# Patient Record
Sex: Male | Born: 1965 | Race: Black or African American | Hispanic: No | Marital: Single | Smoking: Former smoker
Health system: Southern US, Community
[De-identification: ages and names within clinical notes are randomized; demographics above are authoritative.]

## PROBLEM LIST (undated history)

## (undated) DIAGNOSIS — D550 Anemia due to glucose-6-phosphate dehydrogenase [G6PD] deficiency: Secondary | ICD-10-CM

## (undated) DIAGNOSIS — M1990 Unspecified osteoarthritis, unspecified site: Secondary | ICD-10-CM

## (undated) DIAGNOSIS — K429 Umbilical hernia without obstruction or gangrene: Secondary | ICD-10-CM

## (undated) DIAGNOSIS — M549 Dorsalgia, unspecified: Secondary | ICD-10-CM

## (undated) DIAGNOSIS — G8929 Other chronic pain: Secondary | ICD-10-CM

## (undated) HISTORY — DX: Umbilical hernia without obstruction or gangrene: K42.9

---

## 1998-11-08 ENCOUNTER — Emergency Department (HOSPITAL_COMMUNITY): Admission: EM | Admit: 1998-11-08 | Discharge: 1998-11-08 | Payer: Self-pay | Admitting: *Deleted

## 2011-04-15 ENCOUNTER — Encounter (INDEPENDENT_AMBULATORY_CARE_PROVIDER_SITE_OTHER): Payer: Self-pay | Admitting: General Surgery

## 2011-04-15 ENCOUNTER — Ambulatory Visit (INDEPENDENT_AMBULATORY_CARE_PROVIDER_SITE_OTHER): Payer: BC Managed Care – PPO | Admitting: General Surgery

## 2011-04-15 VITALS — BP 124/88 | HR 68 | Temp 98.6°F | Resp 12 | Ht 65.0 in | Wt 166.8 lb

## 2011-04-15 DIAGNOSIS — K429 Umbilical hernia without obstruction or gangrene: Secondary | ICD-10-CM

## 2011-04-15 NOTE — Progress Notes (Signed)
Patient ID: Caleb Foley, male   DOB: 04-24-1966, 45 y.o.   MRN: [id number]  Chief Complaint  Patient presents with  . Other    new pt- eval umb hernia    HPI Caleb Foley is a 45 y.o. male.  This patient is referred by Dr. Geralyn Flash. He has noted a bulge at his umbilicus for "all my life" and he has occasional discomfort in the area which comes on at random times. He states that he has pain worse with laughing and straining and states that the bulge has been slowly increasing in size. He has no tract symptoms and states that his bowels are normal but they have been increasingly loose over the last 2 months. He denies any other obstructive symptoms such as nausea or vomiting. He has some bright blood on the tissue and after wiping but denies a family history of colon cancer and denies any prior history of colonoscopy HPI  Past Medical History  Diagnosis Date  . Umbilical hernia     History reviewed. No pertinent past surgical history.  History reviewed. No pertinent family history.  Social History History  Substance Use Topics  . Smoking status: Current Everyday Smoker    Types: Cigars  . Smokeless tobacco: Not on file   Comment: smoking 4 black n milds a day  . Alcohol Use: Yes  All other review of systems negative or noncontributory except as stated in the HPI   No Known Allergies  No current outpatient prescriptions on file.    Review of Systems Review of Systems  Blood pressure 124/88, pulse 68, temperature 98.6 F (37 C), temperature source Temporal, resp. rate 12, height 5\' 5"  (1.651 m), weight 166 lb 12.8 oz (75.66 kg).  Physical Exam Physical Exam  Vitals reviewed. Constitutional: He is oriented to person, place, and time. He appears well-developed and well-nourished. No distress.  HENT:  Head: Normocephalic and atraumatic.  Eyes: Conjunctivae are normal. Pupils are equal, round, and reactive to light. Right eye exhibits no discharge.  Left eye exhibits no discharge. No scleral icterus.  Neck: Normal range of motion. No tracheal deviation present.  Cardiovascular: Normal rate, regular rhythm and normal heart sounds.   Pulmonary/Chest: Effort normal and breath sounds normal. No stridor. No respiratory distress. He has no wheezes.  Abdominal: Soft. Bowel sounds are normal. He exhibits no distension and no mass. There is no tenderness. There is no rebound and no guarding.       Reducible umbilical hernia with 3cm palpable defect.  Musculoskeletal: Normal range of motion. He exhibits no edema.  Neurological: He is alert and oriented to person, place, and time.  Skin: Skin is warm and dry. No rash noted. He is not diaphoretic. No erythema. No pallor.  Psychiatric: He has a normal mood and affect. His behavior is normal. Judgment and thought content normal.    Data Reviewed   Assessment    Reducible umbilical hernia  He does have a reducible umbilical hernia which is moderate size and is symptomatic. Since he is symptomatic and it is increasing in size I did offer surgical repair. I discussed with him the options of watchful waiting versus surgery and the risks of the procedure including infection, bleeding, pain, scarring, recurrence, persistent symptoms, bowel injury and he expressed understanding and desires to proceed with open umbilical hernia repair with mesh.  Bright red blood per rectum  I think that is it is likely due to hemorrhoids or other anal cause. However, since  he has not had a prior colonoscopy I did recommend that he have a colonoscopy.  Plan    Plan for open umbilical hernia repair soon as available. Recommend outpatient colonoscopy to evaluate rectal bleeding.       Lodema Pilot DAVID 04/15/2011, 3:11 PM

## 2011-04-30 ENCOUNTER — Ambulatory Visit: Admit: 2011-04-30 | Payer: Self-pay | Admitting: General Surgery

## 2011-04-30 DIAGNOSIS — K429 Umbilical hernia without obstruction or gangrene: Secondary | ICD-10-CM

## 2011-04-30 HISTORY — PX: HERNIA REPAIR: SHX51

## 2011-04-30 SURGERY — REPAIR, HERNIA, UMBILICAL, ADULT
Anesthesia: General

## 2011-05-26 ENCOUNTER — Telehealth (INDEPENDENT_AMBULATORY_CARE_PROVIDER_SITE_OTHER): Payer: Self-pay

## 2011-05-26 NOTE — Telephone Encounter (Addendum)
Called to speak with patient regarding his appointment on 05/27/11 w/Dr. Biagio Quint.  Was placed on hold for over 7 minutes until I disconnected the call, attempted to call back, no answer and left message on voicemail to call our office at 8:30 regarding his appointment time has changed to 9:00 am.

## 2011-05-27 ENCOUNTER — Encounter (INDEPENDENT_AMBULATORY_CARE_PROVIDER_SITE_OTHER): Payer: Self-pay | Admitting: General Surgery

## 2011-05-27 ENCOUNTER — Ambulatory Visit (INDEPENDENT_AMBULATORY_CARE_PROVIDER_SITE_OTHER): Payer: BC Managed Care – PPO | Admitting: General Surgery

## 2011-05-27 VITALS — BP 142/104 | HR 76 | Temp 97.4°F | Resp 16 | Ht 65.0 in | Wt 171.2 lb

## 2011-05-27 DIAGNOSIS — Z5189 Encounter for other specified aftercare: Secondary | ICD-10-CM

## 2011-05-27 DIAGNOSIS — Z4889 Encounter for other specified surgical aftercare: Secondary | ICD-10-CM

## 2011-05-27 NOTE — Progress Notes (Signed)
Subjective:     Patient ID: Caleb Foley, male   DOB: 02-24-1966, 46 y.o.   MRN: [id number]  HPI This patient follows up roughly 2 weeks status post open umbilical hernia repair with mesh. He's been doing well he does have some occasional discomfort in the region. He is tolerating regular diet and her bowels are functional, no other complaints.  Review of Systems     Objective:   Physical Exam No acute distress and nontoxic-appearing  His abdomen is soft and nontender on exam his incision is healing well without sign of infection there is no evidence of recurrence. He does have some mild tenderness in the area but no bulge or sign of recurrence     Assessment:     Status post open umbilical hernia repair with mesh-doing well    Plan:     I recommended that he continue with another week to light-duty but at approximately 4 weeks with surgery he can gradually increase his activity as tolerated. There is no sign of postoperative complications and he appears to be doing well. I did recommend that he not do any heavy lifting for another few weeks if he can avoid this.

## 2011-11-16 DIAGNOSIS — Z0271 Encounter for disability determination: Secondary | ICD-10-CM

## 2012-04-20 ENCOUNTER — Ambulatory Visit (INDEPENDENT_AMBULATORY_CARE_PROVIDER_SITE_OTHER): Payer: BC Managed Care – PPO | Admitting: Family Medicine

## 2012-04-20 VITALS — BP 112/75 | HR 87 | Temp 98.4°F | Resp 18 | Ht 67.0 in | Wt 158.0 lb

## 2012-04-20 DIAGNOSIS — M5431 Sciatica, right side: Secondary | ICD-10-CM

## 2012-04-20 DIAGNOSIS — M4800 Spinal stenosis, site unspecified: Secondary | ICD-10-CM

## 2012-04-20 DIAGNOSIS — R202 Paresthesia of skin: Secondary | ICD-10-CM

## 2012-04-20 DIAGNOSIS — R209 Unspecified disturbances of skin sensation: Secondary | ICD-10-CM

## 2012-04-20 LAB — POCT GLYCOSYLATED HEMOGLOBIN (HGB A1C): Hemoglobin A1C: 5

## 2012-04-20 MED ORDER — METHYLPREDNISOLONE 4 MG PO KIT: PACK | ORAL | Status: DC

## 2012-04-20 NOTE — Progress Notes (Signed)
[facility]:  Office Visit  Chief Complaint:  Chief Complaint  Patient presents with  . pinched nerve    back    HPI: Caleb Foley is a 46 y.o. male who complains of right lower back pain and has had pain that goes from his right buttock, posterior thigh and then at knee it changes to the lateral part of his leg. He woke up this morning with numbness and tingling in his last 4 digits. He has tried ibuprofen without much relief. He is a Naval architect, [employer]. He states that he has stiffness in leg and buttock and is improved with movement as the day goes on. He denies any weakness, no problems with urinary incontinence.  HE was seen at [facility] today, xray was done of lower back, he was told that he had "pinch nerve", He was rx Ibuprofen and Flexeril. Was told to come to family practice to get referral to PT. Deneis any h/o diabetes. NKI. COnstant numbness/tingling. He used to wear his wallet under his right butt pocket, he started taking it out when driving after sxs  Past Medical History  Diagnosis Date  . Umbilical hernia    Past Surgical History  Procedure Date  . Hernia repair 04/30/11    umb hernia   History   Social History  . Marital Status: Single    Spouse Name: N/A    Number of Children: N/A  . Years of Education: N/A   Social History Main Topics  . Smoking status: Current Every Day Smoker    Types: Cigars  . Smokeless tobacco: None     Comment: smoking 4 black n milds a day  . Alcohol Use: Yes  . Drug Use: No  . Sexually Active:    Other Topics Concern  . None   Social History Narrative  . None   History reviewed. No pertinent family history. No Known Allergies Prior to Admission medications   Not on File     ROS: The patient denies fevers, chills, night sweats, unintentional weight loss, chest pain, palpitations, wheezing, dyspnea on exertion, nausea, vomiting, abdominal pain, dysuria, hematuria, melena, ,  weakness  All other systems have been reviewed and were otherwise negative with the exception of those mentioned in the HPI and as above.    PHYSICAL EXAM: Filed Vitals:   04/20/12 1301  BP: 112/75  Pulse: 87  Temp: 98.4 F (36.9 C)  Resp: 18   Filed Vitals:   04/20/12 1301  Height: 5\' 7"  (1.702 m)  Weight: 158 lb (71.668 kg)   Body mass index is 24.75 kg/(m^2).  General: Alert, no acute distress HEENT:  Normocephalic, atraumatic, oropharynx patent.  Cardiovascular:  Regular rate and rhythm, no rubs murmurs or gallops.  No Carotid bruits, radial pulse intact. No pedal edema.  Respiratory: Clear to auscultation bilaterally.  No wheezes, rales, or rhonchi.  No cyanosis, no use of accessory musculature GI: No organomegaly, abdomen is soft and non-tender, positive bowel sounds.  No masses. Skin: No rashes. Neurologic: Facial musculature symmetric. Psychiatric: Patient is appropriate throughout our interaction. Lymphatic: No cervical lymphadenopathy Musculoskeletal: Gait intact. Lumbar spine-nl Sacrum-mid buttock pain at piriformis Right leg and foot 5/5 strength Decrease sensation in right dorsum of foot in S1 distribution No saddle anesthesia + straight leg     LABS: Results for orders placed in visit on 04/20/12  POCT GLYCOSYLATED HEMOGLOBIN (HGB A1C)      Component Value Range   Hemoglobin A1C  5.0       EKG/XRAY:   Primary read interpreted by Dr. Conley Rolls at [facility].   ASSESSMENT/PLAN: Encounter Diagnoses  Name Primary?  . Paresthesia Yes  . Sciatica of right side    ? Sciatica vs piriformis syndrome Ardith Dark MD at [facility], [phone] ( need to get back xray radiology report fromt heir office) Rx Medrol dose pack Patient already has rx for flexeril and also ibuprofen Use flexeril only at night. Advise to return on Apr 26, 2012. He may still work, he has no loss of strength, just some mild S1 distribution numbness and tingling in his right  leg    Damia Bobrowski PHUONG, DO 04/20/2012 2:23 PM

## 2012-04-26 ENCOUNTER — Ambulatory Visit (INDEPENDENT_AMBULATORY_CARE_PROVIDER_SITE_OTHER): Payer: BC Managed Care – PPO | Admitting: Internal Medicine

## 2012-04-26 ENCOUNTER — Ambulatory Visit: Payer: BC Managed Care – PPO

## 2012-04-26 VITALS — BP 124/75 | HR 91 | Temp 97.5°F | Resp 18 | Ht 67.0 in | Wt 158.0 lb

## 2012-04-26 DIAGNOSIS — IMO0002 Reserved for concepts with insufficient information to code with codable children: Secondary | ICD-10-CM

## 2012-04-26 DIAGNOSIS — M5417 Radiculopathy, lumbosacral region: Secondary | ICD-10-CM

## 2012-04-26 DIAGNOSIS — M5416 Radiculopathy, lumbar region: Secondary | ICD-10-CM

## 2012-04-26 DIAGNOSIS — R202 Paresthesia of skin: Secondary | ICD-10-CM

## 2012-04-26 MED ORDER — PREDNISONE 10 MG PO TABS: ORAL_TABLET | ORAL | Status: DC

## 2012-04-26 NOTE — Progress Notes (Signed)
  Subjective:    Patient ID: Caleb Foley, male    DOB: 06-May-1966, 46 y.o.   MRN: [id number]  HPI Lowback pain started one day not assoc with a remembered injury. 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   Review of Systems     Objective:   Physical Exam  Constitutional: He is oriented to person, place, and time. He appears well-developed.  Musculoskeletal:       Lumbar back: He exhibits tenderness and spasm. He exhibits normal range of motion.       Back:  Neurological: He is alert and oriented to person, place, and time. He displays abnormal reflex. A sensory deficit is present. He exhibits abnormal muscle tone. Coordination and gait normal.  Reflex Scores:      Patellar reflexes are 2+ on the right side and 2+ on the left side.      Achilles reflexes are 2+ on the right side and 1+ on the left side. Psychiatric: He has a normal mood and affect.  weakness plantar flexion    [facility] reading (PRIMARY) by  Dr.Guest mild spondylosis, right SI joint unusual looking      Assessment & Plan:  LB injury with radiculopathy, paresthesias and weak right plantar flexion and ankle dtr Back care/Rest, avoid sitting, do no bending or lifting Prednisone taper/flexeril/ No nsaids with prednisone

## 2012-04-26 NOTE — Patient Instructions (Signed)
Disc Back Pain, Adult Low back pain is very common. About 1 in 5 people have back pain.The cause of low back pain is rarely dangerous. The pain often gets better over time.About half of people with a sudden onset of back pain feel better in just 2 weeks. About 8 in 10 people feel better by 6 weeks.  CAUSES Some common causes of back pain include:  Strain of the muscles or ligaments supporting the spine.  Wear and tear (degeneration) of the spinal discs.  Arthritis.  Direct injury to the back. DIAGNOSIS Most of the time, the direct cause of low back pain is not known.However, back pain can be treated effectively even when the exact cause of the pain is unknown.Answering your caregiver's questions about your overall health and symptoms is one of the most accurate ways to make sure the cause of your pain is not dangerous. If your caregiver needs more information, he or she may order lab work or imaging tests (X-rays or MRIs).However, even if imaging tests show changes in your back, this usually does not require surgery. HOME CARE INSTRUCTIONS For many people, back pain returns.Since low back pain is rarely dangerous, it is often a condition that people can learn to [facility] their own.   Remain active. It is stressful on the back to sit or stand in one place. Do not sit, drive, or stand in one place for more than 30 minutes at a time. Take short walks on level surfaces as soon as pain allows.Try to increase the length of time you walk each day.  Do not stay in bed.Resting more than 1 or 2 days can delay your recovery.  Do not avoid exercise or work.Your body is made to move.It is not dangerous to be active, even though your back may hurt.Your back will likely heal faster if you return to being active before your pain is gone.  Pay attention to your body when you bend and lift. Many people have less discomfortwhen lifting if they bend their knees, keep the load close to their  bodies,and avoid twisting. Often, the most comfortable positions are those that put less stress on your recovering back.  Find a comfortable position to sleep. Use a firm mattress and lie on your side with your knees slightly bent. If you lie on your back, put a pillow under your knees.  Only take over-the-counter or prescription medicines as directed by your caregiver. Over-the-counter medicines to reduce pain and inflammation are often the most helpful.Your caregiver may prescribe muscle relaxant drugs.These medicines help dull your pain so you can more quickly return to your normal activities and healthy exercise.  Put ice on the injured area.  Put ice in a plastic bag.  Place a towel between your skin and the bag.  Leave the ice on for 15 to 20 minutes, 3 to 4 times a day for the first 2 to 3 days. After that, ice and heat may be alternated to reduce pain and spasms.  Ask your caregiver about trying back exercises and gentle massage. This may be of some benefit.  Avoid feeling anxious or stressed.Stress increases muscle tension and can worsen back pain.It is important to recognize when you are anxious or stressed and learn ways to manage it.Exercise is a great option. SEEK MEDICAL CARE IF:  You have pain that is not relieved with rest or medicine.  You have pain that does not improve in 1 week.  You have new symptoms.  You are  generally not feeling well. SEEK IMMEDIATE MEDICAL CARE IF:   You have pain that radiates from your back into your legs.  You develop new bowel or bladder control problems.  You have unusual weakness or numbness in your arms or legs.  You develop nausea or vomiting.  You develop abdominal pain.  You feel faint. Document Released: 05/04/2005 Document Revised: 11/03/2011 Document Reviewed: 09/22/2010 [facility] Patient Information 2013 [place], [place].

## 2012-05-03 ENCOUNTER — Ambulatory Visit
Admission: RE | Admit: 2012-05-03 | Discharge: 2012-05-03 | Disposition: A | Discharge status: Home / Self Care | Payer: BC Managed Care – PPO | Source: Ambulatory Visit | Attending: Internal Medicine | Admitting: Internal Medicine

## 2012-05-03 ENCOUNTER — Ambulatory Visit (INDEPENDENT_AMBULATORY_CARE_PROVIDER_SITE_OTHER): Payer: BC Managed Care – PPO | Admitting: Internal Medicine

## 2012-05-03 VITALS — BP 133/89 | HR 94 | Temp 98.3°F | Resp 16 | Ht 65.0 in | Wt 171.0 lb

## 2012-05-03 DIAGNOSIS — M5410 Radiculopathy, site unspecified: Secondary | ICD-10-CM

## 2012-05-03 DIAGNOSIS — R200 Anesthesia of skin: Secondary | ICD-10-CM

## 2012-05-03 DIAGNOSIS — S39012A Strain of muscle, fascia and tendon of lower back, initial encounter: Secondary | ICD-10-CM

## 2012-05-03 DIAGNOSIS — M545 Low back pain: Secondary | ICD-10-CM

## 2012-05-03 DIAGNOSIS — M549 Dorsalgia, unspecified: Secondary | ICD-10-CM

## 2012-05-03 DIAGNOSIS — R209 Unspecified disturbances of skin sensation: Secondary | ICD-10-CM

## 2012-05-03 MED ORDER — TRAMADOL HCL 50 MG PO TABS: 50.0000 mg | ORAL_TABLET | Freq: Three times a day (TID) | ORAL | Status: DC | PRN

## 2012-05-03 NOTE — Progress Notes (Signed)
  Subjective:    Patient ID: Caleb Foley, male    DOB: Jan 06, 1966, 46 y.o.   MRN: [id number]  HPI Pt presents to clinic today for a recheck of his LBP. He finished his course of Prednisone Sunday 05/01/12 and he still is continuing to take the Flexeril at nighttime. Pt reports the numbness and tingling sensations going down the Rt leg came back on Sunday 05/01/12.  Pt states this is the same way he felt after the first round of Prednisone he took at the beginning of the month prescribed by Dr. Conley Rolls.  Has pain right LS into right leg with numbness and weakness. He has suffered over 2 months and no better. 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       Assessment & Plan:  MRI no contrast tonite or tomorrow Tramadol prn pain

## 2012-05-03 NOTE — Progress Notes (Signed)
  Subjective:    Patient ID: Caleb Foley, male    DOB: 09/25/1965, 46 y.o.   MRN: [id number]  HPI Low back pain with radiculopathy no better. Pred helped , then sxs returned.   Review of Systems     Objective:   Physical Exam        Assessment & Plan:

## 2012-05-03 NOTE — Patient Instructions (Addendum)

## 2012-05-05 ENCOUNTER — Other Ambulatory Visit: Payer: Self-pay | Admitting: Radiology

## 2012-05-05 DIAGNOSIS — M5126 Other intervertebral disc displacement, lumbar region: Secondary | ICD-10-CM

## 2012-05-06 ENCOUNTER — Encounter (HOSPITAL_COMMUNITY): Payer: Self-pay | Admitting: Pharmacy Technician

## 2012-05-06 ENCOUNTER — Other Ambulatory Visit: Payer: Self-pay | Admitting: Neurosurgery

## 2012-05-09 ENCOUNTER — Encounter (HOSPITAL_COMMUNITY)
Admission: RE | Admit: 2012-05-09 | Discharge: 2012-05-09 | Disposition: A | Discharge status: Home / Self Care | Payer: BC Managed Care – PPO | Source: Ambulatory Visit | Attending: Neurosurgery | Admitting: Neurosurgery

## 2012-05-09 ENCOUNTER — Encounter (HOSPITAL_COMMUNITY): Payer: Self-pay | Admitting: Vascular Surgery

## 2012-05-09 ENCOUNTER — Encounter (HOSPITAL_COMMUNITY): Payer: Self-pay

## 2012-05-09 HISTORY — DX: Dorsalgia, unspecified: M54.9

## 2012-05-09 HISTORY — DX: Other chronic pain: G89.29

## 2012-05-09 HISTORY — DX: Unspecified osteoarthritis, unspecified site: M19.90

## 2012-05-09 LAB — BASIC METABOLIC PANEL
CO2: 26 mEq/L (ref 19–32)
Chloride: 101 mEq/L (ref 96–112)
GFR calc non Af Amer: >90 mL/min (ref >90)
Glucose, Bld: 102 mg/dL — ABNORMAL HIGH (ref 70–99)
Potassium: 4 mEq/L (ref 3.5–5.1)
Sodium: 137 mEq/L (ref 135–145)

## 2012-05-09 LAB — CBC
HCT: 23.9 % — ABNORMAL LOW (ref 39.0–52.0)
Hemoglobin: 7.4 g/dL — ABNORMAL LOW (ref 13.0–17.0)
MCHC: 31 g/dL (ref 30.0–36.0)
RBC: 3.21 MIL/uL — ABNORMAL LOW (ref 4.22–5.81)
WBC: 7.6 10*3/uL (ref 4.0–10.5)

## 2012-05-09 LAB — SURGICAL PCR SCREEN: Staphylococcus aureus: NEGATIVE

## 2012-05-09 MED ORDER — CEFAZOLIN SODIUM-DEXTROSE 2-3 GM-% IV SOLR
2.0000 g | INTRAVENOUS | Status: DC
  Filled 2012-05-09: qty 50

## 2012-05-09 NOTE — Pre-Procedure Instructions (Signed)
[name]  05/09/2012   Your procedure is scheduled on:  Tuesday May 10, 2012  Report to [facility] Stay [facility] at 5:30 AM.  Call this number if you have problems the morning of surgery: [phone]   Remember:   Do not eat food or drink :After Midnight.    Take these medicines the morning of surgery with A SIP OF WATER: tramadol   Do not wear jewelry, make-up or nail polish.  Do not wear lotions, powders, or perfumes.  Do not shave 48 hours prior to surgery. Men may shave face and neck.  Do not bring valuables to the hospital.  Contacts, dentures or bridgework may not be worn into surgery.  Leave suitcase in the car. After surgery it may be brought to your room.  For patients admitted to the hospital, checkout time is 11:00 AM the day of discharge.   Patients discharged the day of surgery will not be allowed to drive home.  Name and phone number of your driver: family / friend  Special Instructions: Shower using CHG 2 nights before surgery and the night before surgery.  If you shower the day of surgery use CHG.  Use special wash - you have one bottle of CHG for all showers.  You should use approximately 1/3 of the bottle for each shower.   Please read over the following fact sheets that you were given: Pain Booklet, Coughing and Deep Breathing, MRSA Information and Surgical Site Infection Prevention

## 2012-05-09 NOTE — Pre-Procedure Instructions (Deleted)
[name]  05/09/2012   Your procedure is scheduled on:  Tuesday May 10, 2012  Report to [facility] Stay [facility] at 5:30 AM.  Call this number if you have problems the morning of surgery: [phone]   Remember:   Do not eat food or drink :After Midnight.    Take these medicines the morning of surgery with A SIP OF WATER: tramadol   Do not wear jewelry, make-up or nail polish.  Do not wear lotions, powders, or perfumes.  Do not shave 48 hours prior to surgery. Men may shave face and neck.  Do not bring valuables to the hospital.  Contacts, dentures or bridgework may not be worn into surgery.  Leave suitcase in the car. After surgery it may be brought to your room.  For patients admitted to the hospital, checkout time is 11:00 AM the day of discharge.   Patients discharged the day of surgery will not be allowed to drive home.  Name and phone number of your driver: family / friend  Special Instructions: Shower using CHG 2 nights before surgery and the night before surgery.  If you shower the day of surgery use CHG.  Use special wash - you have one bottle of CHG for all showers.  You should use approximately 1/3 of the bottle for each shower.   Please read over the following fact sheets that you were given: Pain Booklet, Coughing and Deep Breathing, Total Joint Packet and MRSA Information

## 2012-05-09 NOTE — Consult Note (Signed)
Anesthesia Chart Review:  Patient is a 46 year old male scheduled for right L4-5 diskectomy by Dr. Franky Macho tomorrow at 0730.  His PAT appointment was earlier today.  I did not see him, but was asked to review his labs.  History includes smoking, arthritis, back pain.  No PCP is listed.  He did not meet anesthesia's requirements for pre-operative EKG and CXR.    Preoperative labs showed a H/H 7.4/23.9.  Dr. Franky Macho is aware of these results and is okay with proceeding to the OR.  I'll order a repeat CBC and T&S on arrival. Anesthesiologist Dr. Chaney Malling agrees with this plan.  Shonna Chock, PA-C 05/09/12 1507

## 2012-05-09 NOTE — Progress Notes (Signed)
Spoke with Sherri with Dr. Sueanne Margarita office, State Dr. Franky Macho states ok for surgery.

## 2012-05-09 NOTE — Progress Notes (Addendum)
Forwarded chart to anesthesia for review due to abnormal labs 05/09/12. Spoke with Revonda Standard regarding patients abnormal hgb.  Requested Dr. Sueanne Margarita office be contacted. Notified Jessica with Dr. Sueanne Margarita office.

## 2012-05-10 ENCOUNTER — Telehealth: Payer: Self-pay | Admitting: Internal Medicine

## 2012-05-10 ENCOUNTER — Encounter (HOSPITAL_COMMUNITY): Payer: Self-pay | Admitting: *Deleted

## 2012-05-10 ENCOUNTER — Ambulatory Visit (HOSPITAL_COMMUNITY)
Admission: RE | Admit: 2012-05-10 | Discharge: 2012-05-10 | Disposition: A | Discharge status: Home / Self Care | Payer: BC Managed Care – PPO | Source: Ambulatory Visit | Attending: Neurosurgery | Admitting: Neurosurgery

## 2012-05-10 ENCOUNTER — Encounter (HOSPITAL_COMMUNITY): Payer: Self-pay | Admitting: Vascular Surgery

## 2012-05-10 ENCOUNTER — Encounter (HOSPITAL_COMMUNITY)
Admission: RE | Disposition: A | Discharge status: Home / Self Care | Payer: Self-pay | Source: Ambulatory Visit | Attending: Neurosurgery

## 2012-05-10 DIAGNOSIS — Z538 Procedure and treatment not carried out for other reasons: Secondary | ICD-10-CM | POA: Insufficient documentation

## 2012-05-10 DIAGNOSIS — M5126 Other intervertebral disc displacement, lumbar region: Secondary | ICD-10-CM | POA: Insufficient documentation

## 2012-05-10 DIAGNOSIS — D649 Anemia, unspecified: Secondary | ICD-10-CM | POA: Insufficient documentation

## 2012-05-10 HISTORY — DX: Anemia due to glucose-6-phosphate dehydrogenase (g6pd) deficiency: D55.0

## 2012-05-10 LAB — TYPE AND SCREEN: Antibody Screen: NEGATIVE

## 2012-05-10 LAB — CBC
HCT: 21.2 % — ABNORMAL LOW (ref 39.0–52.0)
RBC: 2.83 MIL/uL — ABNORMAL LOW (ref 4.22–5.81)
RDW: 17.9 % — ABNORMAL HIGH (ref 11.5–15.5)
WBC: 5.8 10*3/uL (ref 4.0–10.5)

## 2012-05-10 LAB — ABO/RH: ABO/RH(D): O POS

## 2012-05-10 SURGERY — CANCELLED PROCEDURE
Laterality: Right

## 2012-05-10 MED ORDER — BUPIVACAINE LIPOSOME 1.3 % IJ SUSP
20.0000 mL | INTRAMUSCULAR | Status: DC
  Filled 2012-05-10: qty 20

## 2012-05-10 SURGICAL SUPPLY — 38 items
BAG DECANTER FOR FLEXI CONT (MISCELLANEOUS) ×2 IMPLANT
BENZOIN TINCTURE PRP APPL 2/3 (GAUZE/BANDAGES/DRESSINGS) IMPLANT
BLADE SURG ROTATE 9660 (MISCELLANEOUS) IMPLANT
CANISTER SUCTION 2500CC (MISCELLANEOUS) ×2 IMPLANT
CLOTH BEACON ORANGE TIMEOUT ST (SAFETY) ×2 IMPLANT
CONT SPEC 4OZ CLIKSEAL STRL BL (MISCELLANEOUS) ×2 IMPLANT
DECANTER SPIKE VIAL GLASS SM (MISCELLANEOUS) ×2 IMPLANT
DRAPE LAPAROTOMY 100X72X124 (DRAPES) ×2 IMPLANT
DRAPE MICROSCOPE LEICA (MISCELLANEOUS) ×2 IMPLANT
DRAPE POUCH INSTRU U-SHP 10X18 (DRAPES) ×2 IMPLANT
DRAPE SURG 17X23 STRL (DRAPES) ×2 IMPLANT
DURAPREP 26ML APPLICATOR (WOUND CARE) ×2 IMPLANT
ELECT REM PT RETURN 9FT ADLT (ELECTROSURGICAL) ×2
ELECTRODE REM PT RTRN 9FT ADLT (ELECTROSURGICAL) ×1 IMPLANT
GAUZE SPONGE 4X4 16PLY XRAY LF (GAUZE/BANDAGES/DRESSINGS) IMPLANT
GLOVE ECLIPSE 6.5 STRL STRAW (GLOVE) ×2 IMPLANT
GLOVE EXAM NITRILE LRG STRL (GLOVE) IMPLANT
GLOVE EXAM NITRILE MD LF STRL (GLOVE) IMPLANT
GLOVE EXAM NITRILE XL STR (GLOVE) IMPLANT
GLOVE EXAM NITRILE XS STR PU (GLOVE) IMPLANT
GOWN BRE IMP SLV AUR LG STRL (GOWN DISPOSABLE) ×4 IMPLANT
GOWN BRE IMP SLV AUR XL STRL (GOWN DISPOSABLE) IMPLANT
GOWN STRL REIN 2XL LVL4 (GOWN DISPOSABLE) IMPLANT
KIT BASIN OR (CUSTOM PROCEDURE TRAY) ×2 IMPLANT
KIT ROOM TURNOVER OR (KITS) ×2 IMPLANT
NEEDLE HYPO 25X1 1.5 SAFETY (NEEDLE) ×2 IMPLANT
NEEDLE SPNL 18GX3.5 QUINCKE PK (NEEDLE) IMPLANT
NS IRRIG 1000ML POUR BTL (IV SOLUTION) ×2 IMPLANT
PAD ARMBOARD 7.5X6 YLW CONV (MISCELLANEOUS) ×6 IMPLANT
RUBBERBAND STERILE (MISCELLANEOUS) ×4 IMPLANT
SPONGE GAUZE 4X4 12PLY (GAUZE/BANDAGES/DRESSINGS) IMPLANT
SPONGE LAP 4X18 X RAY DECT (DISPOSABLE) IMPLANT
SPONGE SURGIFOAM ABS GEL SZ50 (HEMOSTASIS) ×2 IMPLANT
STRIP CLOSURE SKIN 1/2X4 (GAUZE/BANDAGES/DRESSINGS) IMPLANT
SYR 20ML ECCENTRIC (SYRINGE) ×2 IMPLANT
TOWEL OR 17X24 6PK STRL BLUE (TOWEL DISPOSABLE) ×2 IMPLANT
TOWEL OR 17X26 10 PK STRL BLUE (TOWEL DISPOSABLE) ×2 IMPLANT
WATER STERILE IRR 1000ML POUR (IV SOLUTION) ×2 IMPLANT

## 2012-05-10 NOTE — Anesthesia Postprocedure Evaluation (Signed)
  Anesthesia Post-op Note  Patient: Caleb Foley  Procedure(s) Performed: Procedure(s) (LRB) with comments: LUMBAR LAMINECTOMY/DECOMPRESSION MICRODISCECTOMY 1 LEVEL (Right) - RIGHT L45 diskectomy  Case cancelled.  Postponed pending work up of anemia, Hb 6.6.

## 2012-05-10 NOTE — Telephone Encounter (Signed)
S/W pt in re NP appt 12/30 @ 9:45 w/Dr. Arbutus Ped.  Referring Dr. Coletta Memos Dx-Chronic Anemia Welcome Packet mailed.

## 2012-05-10 NOTE — Anesthesia Preprocedure Evaluation (Signed)
Anesthesia Evaluation  Patient identified by MRN, date of birth, ID band Patient awake    Reviewed: Allergy & Precautions, H&P , NPO status , Patient's Chart, lab work & pertinent test results  History of Anesthesia Complications Negative for: history of anesthetic complications  Airway Mallampati: II TM Distance: >3 FB Neck ROM: Full    Dental  (+) Dental Advisory Given, Poor Dentition and Chipped   Pulmonary Current Smoker,          Cardiovascular negative cardio ROS      Neuro/Psych Chronic back pain to Right leg: narcotics    GI/Hepatic Neg liver ROS, GERD-  Poorly Controlled,Recent black tarry stool: Hb 6.6 today   Endo/Other  negative endocrine ROS  Renal/GU negative Renal ROS     Musculoskeletal   Abdominal   Peds  Hematology   Anesthesia Other Findings   Reproductive/Obstetrics                           Anesthesia Physical Anesthesia Plan  ASA: III  Anesthesia Plan:    Post-op Pain Management:    Induction:   Airway Management Planned:   Additional Equipment:   Intra-op Plan:   Post-operative Plan:   Informed Consent: I have reviewed the patients History and Physical, chart, labs and discussed the procedure including the risks, benefits and alternatives for the proposed anesthesia with the patient or authorized representative who has indicated his/her understanding and acceptance.     Plan Discussed with: CRNA and Surgeon  Anesthesia Plan Comments: (Patient with recent black, tarry stool, Hb 7.4 2 days ago, 6.6 today.  Discussed with Dr. Krista Blue and Sampson Goon, who agree patient should have hematology and/or GI work up prior to elective back surgery. Patient postponed.  Discussed with Dr. Franky Macho.  )        Anesthesia Quick Evaluation

## 2012-05-10 NOTE — Transfer of Care (Signed)
Immediate Anesthesia Transfer of Care Note  Patient: Caleb Foley  Procedure(s) Performed: Procedure(s) (LRB) with comments: LUMBAR LAMINECTOMY/DECOMPRESSION MICRODISCECTOMY 1 LEVEL (Right) - RIGHT L45 diskectomy  Case cancelled.  Postponed pending work up of Hb 6.6.

## 2012-05-10 NOTE — Preoperative (Signed)
Beta Blockers   Reason not to administer Beta Blockers:Not Applicable 

## 2012-05-10 NOTE — H&P (Signed)
BP 123/79  Pulse 85  Temp 98.2 F (36.8 C) (Oral)  Resp 18  SpO2 99%  HISTORY:     Caleb Foley is a 46 year old gentleman who comes in today for evaluation of a large herniated disc on the right side at L4-5 and a history of pain in his right lower extremity over the last 2 months.  Caleb Foley has never had pain like this in the past.  He says he was essentially tolerating it until his right toe started going numb near the beginning of December that led him to the physician's office.  He was given two rounds of oral steroids, which have not helped.  He says he has been off work now for the last week only because he found it very difficult to work.  Dr. Perrin Maltese felt it best that he not work.  He says he did try to work initially after seeing Dr. Perrin Maltese, but it just did not work out well.  He is a Naval architect and left-handed.    PAST MEDICAL HISTORY:  Otherwise good.    FAMILY HISTORY:    Provided no history on.    PAST SURGICAL HISTORY:  He has had an umbilical hernia.    DRUG ALLERGIES:    No known drug allergies.    SOCIAL HISTORY:    He does smoke two cigars a day.  He does have a history of drinking alcohol once weekly.  No history of substance abuse.  He is 164 cm. in height.  He weighs 169 lbs.  He has a pulse of 84 on examination today.    REVIEW OF SYSTEMS:   Positive for leg pain at rest and with walking.  He denies constitutional, eye, ear, nose, throat, mouth, respiratory, gastrointestinal, genitourinary, musculoskeletal, skin, neurological, psychiatric, endocrine, hematologic and allergic problems.    MEDICATIONS:    Medications are Cyclobenzaprine and Ibuprofen.  He has finished his Prednisone and Tramadol.    EXAMINATION:    On examination he is alert, oriented x 4 and answering all questions appropriately.  Minimal distress.  Gait is mildly antalgic favoring the right lower extremity.  2+ reflexes at the left knee, left ankle, biceps, triceps, brachioradialis, 1+ right knee,  1+ right ankle.  No clonus.  No Hoffmann's sign.  Proprioception intact in both upper    and lower extremities.  He can toe walk and heel walk.  Normal muscle tone, bulk and coordination.  Pupils are equal, round and reactive to light.  Full extraocular movements.  Full visual fields.  Hearing intact to finger rub bilaterally.  Uvula elevates in the midline.  Shoulder shrug is normal.  Tongue protrudes in the midline.  Sclera not injected.  No clubbing, cyanosis or edema.  No cervical masses or bruits.  Lung fields clear.  Heart regular rhythm and rate.  No murmurs or rubs.  Pulse is good at both wrists.  Head is normocephalic, atraumatic.    DIAGNOSTIC STUDIES:   MRI lumbar spine is reviewed and shows a normal conus medullaris and cauda equina.  He has a large herniated disc associated with a degenerated disc at L4-5 eccentric to the right side with significant compression of the thecal sac and right L5 root.  The rest of his spine actually looks fairly good.  Plain x-rays were reviewed.  He has some straightening of his lordosis on that film, but his alignment is otherwise normal.    DIAGNOSIS:     1.  Displaced disc L4-5 to  the right.         2.  Right L5 radiculopathy.    SUMMARY:     Caleb Foley presents today with a 29-month history of conservative treatment, steroids and what he describes as weakness in his lower extremity.  At this point in time I think it very reasonable given the size of the disc herniation and after being offered epidural injections and rejected that we pursue operative decompression.  Risk and benefits, bleeding, infection, no relief, need for further surgery, disc recurrence, damage to the nerves causing one or both legs to be weak, footdrops, possibility of bowel or bladder dysfunction, sexual dysfunction all were discussed.  These were discussed along with other risks.  He understands and wishes to proceed.  We will get this scheduled for Christmas Eve.

## 2012-05-10 NOTE — Progress Notes (Signed)
Dr. Franky Macho notified of hemoglobin of 6.6. No orders given.

## 2012-05-10 NOTE — Telephone Encounter (Signed)
C/D 05/10/12 for appt.05/16/12

## 2012-05-16 ENCOUNTER — Encounter: Payer: Self-pay | Admitting: Internal Medicine

## 2012-05-16 ENCOUNTER — Other Ambulatory Visit (HOSPITAL_BASED_OUTPATIENT_CLINIC_OR_DEPARTMENT_OTHER): Payer: BC Managed Care – PPO | Admitting: Lab

## 2012-05-16 ENCOUNTER — Ambulatory Visit (HOSPITAL_BASED_OUTPATIENT_CLINIC_OR_DEPARTMENT_OTHER): Payer: BC Managed Care – PPO | Admitting: Internal Medicine

## 2012-05-16 ENCOUNTER — Telehealth: Payer: Self-pay | Admitting: Internal Medicine

## 2012-05-16 VITALS — BP 125/88 | HR 87 | Temp 98.9°F | Resp 20 | Ht 65.0 in | Wt 168.2 lb

## 2012-05-16 DIAGNOSIS — D649 Anemia, unspecified: Secondary | ICD-10-CM

## 2012-05-16 DIAGNOSIS — D539 Nutritional anemia, unspecified: Secondary | ICD-10-CM

## 2012-05-16 LAB — CBC & DIFF AND RETIC
Basophils Absolute: 0 10*3/uL (ref 0.0–0.1)
Eosinophils Absolute: 0.1 10*3/uL (ref 0.0–0.5)
HCT: 23.7 % — ABNORMAL LOW (ref 38.4–49.9)
HGB: 7.1 g/dL — ABNORMAL LOW (ref 13.0–17.1)
LYMPH%: 24.1 % (ref 14.0–49.0)
MCV: 72.9 fL — ABNORMAL LOW (ref 79.3–98.0)
MONO#: 0.3 10*3/uL (ref 0.1–0.9)
MONO%: 6 % (ref 0.0–14.0)
NEUT#: 3 10*3/uL (ref 1.5–6.5)
NEUT%: 66.3 % (ref 39.0–75.0)
Platelets: 429 10*3/uL — ABNORMAL HIGH (ref 140–400)
RBC: 3.25 10*6/uL — ABNORMAL LOW (ref 4.20–5.82)
WBC: 4.5 10*3/uL (ref 4.0–10.3)

## 2012-05-16 LAB — LACTATE DEHYDROGENASE (CC13): LDH: 128 U/L (ref 125–245)

## 2012-05-16 LAB — COMPREHENSIVE METABOLIC PANEL (CC13)
ALT: 16 U/L (ref 0–55)
CO2: 25 mEq/L (ref 22–29)
Calcium: 9.4 mg/dL (ref 8.4–10.4)
Chloride: 107 mEq/L (ref 98–107)
Creatinine: 0.9 mg/dL (ref 0.7–1.3)
Glucose: 103 mg/dl — ABNORMAL HIGH (ref 70–99)

## 2012-05-16 LAB — RETICULOCYTES (CHCC)
ABS Retic: 82 10*3/uL (ref 19.0–186.0)
Retic Ct Pct: 2.5 % — ABNORMAL HIGH (ref 0.4–2.3)

## 2012-05-16 MED ORDER — INTEGRA PLUS PO CAPS: 1.0000 | ORAL_CAPSULE | Freq: Every morning | ORAL | Status: DC

## 2012-05-16 NOTE — Progress Notes (Signed)
[facility] Telephone:(336) [phone]   Fax:(336) [phone]  CONSULT NOTE  REFERRING PHYSICIAN: Dr. Coletta Memos  REASON FOR CONSULTATION:  46 years old African American male with severe anemia.  HPI Gabrial Domine is a 46 y.o. male was no significant past medical history except for L4-5 herniated disc as well as history of hernia surgery. The patient was seen recently by Dr. Rema Jasmine for evaluation and consideration of surgical intervention for the for-5 herniated disc. Preoperative evaluation with CBC on 05/09/2012 showed low hemoglobin of 7.4 g/dL and hematocrit 45.4%. Repeat CBC on 05/10/2012 showed further decline in his hemoglobin to 6.6 g/dL and hematocrit of 09.8%. His MCV was 74.9. The patient has normal white blood count of 5.8 and normal platelets count of 277,000. He was referred to me today for evaluation and recommendation regarding his anemia. The patient denied having any known history of anemia. He denied having any bleeding issues but he has some problem with hemorrhoids. He has no bruises or ecchymosis.  His main complaint is fatigue and shortness of breath with exertion but he was able to continue to work. He denied having any significant weight loss or night sweats. He denied having any chest pain, cough or hemoptysis. His family history significant for a mother with sickle cell trait and distant cousins with sickle cell disease. The patient is single and has no children. He works as a Naval architect. He smokes cigars on daily basis for the last 9 years and trying to quit. He has no history of alcohol or drug abuse. @SFHPI @  Past Medical History  Diagnosis Date  . Umbilical hernia   . Arthritis   . Chronic back pain   . G6PD deficiency anemia     Past Surgical History  Procedure Date  . Hernia repair 04/30/11    umb hernia    History reviewed. No pertinent family history.  Social History History  Substance Use Topics  . Smoking status: Current  Every Day Smoker    Types: Cigars  . Smokeless tobacco: Not on file     Comment: smoking 4 black n milds a day  . Alcohol Use: Yes     Comment: "social"    No Known Allergies  Current Outpatient Prescriptions  Medication Sig Dispense Refill  . ibuprofen (ADVIL,MOTRIN) 600 MG tablet Take 600 mg by mouth every 8 (eight) hours as needed. For pain      . traMADol (ULTRAM) 50 MG tablet Take 50 mg by mouth every 8 (eight) hours as needed. For pain      . FeFum-FePoly-FA-B Cmp-C-Biot (INTEGRA PLUS) CAPS Take 1 capsule by mouth every morning.  30 capsule  1    Review of Systems  A comprehensive review of systems was negative except for: Constitutional: positive for fatigue Respiratory: positive for dyspnea on exertion  Physical Exam  JXB:JYNWG, healthy, no distress, well nourished and well developed SKIN: skin color, texture, turgor are normal HEAD: Normocephalic, No masses, lesions, tenderness or abnormalities EYES: normal EARS: External ears normal OROPHARYNX:no exudate and no erythema  NECK: supple, no adenopathy LYMPH:  no palpable lymphadenopathy, no hepatosplenomegaly LUNGS: clear to auscultation  HEART: regular rate & rhythm, no murmurs and no gallops ABDOMEN:abdomen soft, non-tender, normal bowel sounds and no masses or organomegaly BACK: Back symmetric, no curvature. EXTREMITIES:no joint deformities, effusion, or inflammation, no edema, no skin discoloration, no clubbing  NEURO: alert & oriented x 3 with fluent speech, no focal motor/sensory deficits  PERFORMANCE STATUS: ECOG 1  LABORATORY DATA: Lab Results  Component Value Date   WBC 4.5 05/16/2012   HGB 7.1* 05/16/2012   HCT 23.7* 05/16/2012   MCV 72.9* 05/16/2012   PLT 429* 05/16/2012      Chemistry      Component Value Date/Time   NA 140 05/16/2012 1020   NA 137 05/09/2012 0829   K 4.5 05/16/2012 1020   K 4.0 05/09/2012 0829   CL 107 05/16/2012 1020   CL 101 05/09/2012 0829   CO2 25 05/16/2012 1020    CO2 26 05/09/2012 0829   BUN 11.0 05/16/2012 1020   BUN 14 05/09/2012 0829   CREATININE 0.9 05/16/2012 1020   CREATININE 0.93 05/09/2012 0829      Component Value Date/Time   CALCIUM 9.4 05/16/2012 1020   CALCIUM 9.7 05/09/2012 0829   ALKPHOS 111 05/16/2012 1020   AST 10 05/16/2012 1020   ALT 16 05/16/2012 1020   BILITOT 0.29 05/16/2012 1020       RADIOGRAPHIC STUDIES: Dg Lumbar Spine Complete  04/26/2012  *RADIOLOGY REPORT*  Clinical Data: Low back pain with radicular symptoms  LUMBAR SPINE - COMPLETE 4+ VIEW  Comparison: None.  Findings: Frontal, lateral, spot lumbosacral lateral, bilateral oblique views were obtained.  There are four strictly non-rib bearing lumbar type vertebral bodies.  The L5 vertebra is transitional with an assimilation joint on the right.  There is no fracture or spondylolisthesis.  There is moderate disc space narrowing at L4-5 and L5 - S1. Other disc spaces appear intact. There is facet osteoarthritic change at L5 S1 bilaterally.  IMPRESSION: Osteoarthritic change as noted above.  No fracture or spondylolisthesis.   Original Report Authenticated By: Bretta Bang, M.D.    Mr Lumbar Spine Wo Contrast  05/03/2012  *RADIOLOGY REPORT*  Clinical Data: Back and right leg pain.  Right leg weakness.  MRI LUMBAR SPINE WITHOUT CONTRAST  Technique:  Multiplanar and multiecho pulse sequences of the lumbar spine were obtained without intravenous contrast.  Comparison: Lumbar spine series 04/26/2012.  Findings: The sagittal MR images demonstrate normal alignment of the lumbar vertebral bodies.  Demonstrate normal marrow signal. The facets are normally aligned.  No pars defects.  The last full intervertebral disc space is labeled L5-S1 and the conus medullaris terminates at T12.  There is partial sacralization of L5 on the plain films.  No significant paraspinal or retroperitoneal process.  L1-2:  No significant findings.  L2-3:  No significant findings.  L3-4:  Annular rent  and a shallow central disc protrusion with mild impression on the ventral thecal sac slightly asymmetric to the right.  There is also moderate facet disease with mild bilateral lateral recess stenosis.  No foraminal stenosis.  L4-5:  Moderate to large sized disc extrusion asymmetric right with significant mass effect on the thecal sac and on the right L5 nerve root.  No foraminal stenosis.  Moderate facet disease.  L5-S1:  No significant findings.  IMPRESSION:  1.  Large right-sided disc extrusion at L4-5 likely accounting for the patient's right leg symptoms. 2.  Small central disc protrusion at L3-4.   Original Report Authenticated By: Rudie Meyer, M.D.     ASSESSMENT: This is a very pleasant 46 years old African American male with severe anemia most likely secondary to iron deficiency plus/minus hemoglobinopathy.   PLAN: I have a lengthy discussion with the patient today about his condition. I ordered several studies to evaluate the activity of his anemia including repeat CBC, comprehensive metabolic panel, LDH, iron study,  ferritin, serum folate, serum B12 level him a serum erythropoietin, serum protein electrophoreses as well as hemoglobin electrophoresis. I would also order testing stool for Hemoccult. The results of the studies are still pending. I will start the patient empirically on Integra plus 1 capsule by mouth daily.  I would see him back for followup visit in 2 weeks for evaluation and discussion of his lab results as well as repeat CBC. The patient was advised to call me immediately if he has any concerning symptoms in the interval.  All questions were answered. The patient knows to call the clinic with any problems, questions or concerns. We can certainly see the patient much sooner if necessary.  Thank you so much for allowing me to participate in the care of Cainen Haaland. I will continue to follow up the patient with you and assist in his care.  I spent 25 minutes counseling the  patient face to face. The total time spent in the appointment was 50 minutes.   Kalia Vahey K. 05/16/2012, 2:38 PM

## 2012-05-16 NOTE — Progress Notes (Signed)
Quick Note:  Call patient and we need to send him Stool card for fecal hemoccult. ______

## 2012-05-16 NOTE — Telephone Encounter (Signed)
appts made and printed for pt aom °

## 2012-05-16 NOTE — Patient Instructions (Signed)
You have severe anemia most likely secondary to iron deficiency. I ordered several studies today. I ordered a prescription for Integra plus 1 capsule by mouth daily. Followup in 2 weeks.

## 2012-05-17 ENCOUNTER — Telehealth: Payer: Self-pay | Admitting: *Deleted

## 2012-05-17 DIAGNOSIS — D649 Anemia, unspecified: Secondary | ICD-10-CM

## 2012-05-17 NOTE — Telephone Encounter (Signed)
Message copied by Caren Griffins on Tue May 17, 2012  1:39 PM ------      Message from: Si Gaul      Created: Mon May 16, 2012  8:22 PM       Call patient and we need to send him Stool card for fecal hemoccult.

## 2012-05-17 NOTE — Telephone Encounter (Signed)
Spoke to pt, he verbalized understanding regarding instructions for picking up stool cards from the front desk and to bring back into the lab once he has collected his stool specimens.  SLJ

## 2012-05-19 LAB — HEMOGLOBINOPATHY EVALUATION
Hgb A2 Quant: 2.2 % (ref 2.2–3.2)
Hgb A: 97.8 % (ref 96.8–97.8)

## 2012-05-20 LAB — SPEP & IFE WITH QIG
Albumin ELP: 54.3 % — ABNORMAL LOW (ref 55.8–66.1)
Alpha-1-Globulin: 8.5 % — ABNORMAL HIGH (ref 2.9–4.9)
IgM, Serum: 160 mg/dL (ref 41–251)

## 2012-05-20 LAB — ERYTHROPOIETIN: Erythropoietin: 256 m[IU]/mL — ABNORMAL HIGH (ref 2.6–34.0)

## 2012-05-20 LAB — IRON AND TIBC: Iron: <10 ug/dL — ABNORMAL LOW (ref 42–165)

## 2012-05-20 LAB — VITAMIN B12: Vitamin B-12: 422 pg/mL (ref 211–911)

## 2012-05-23 ENCOUNTER — Ambulatory Visit (HOSPITAL_BASED_OUTPATIENT_CLINIC_OR_DEPARTMENT_OTHER): Payer: BC Managed Care – PPO | Admitting: Lab

## 2012-05-23 ENCOUNTER — Other Ambulatory Visit: Payer: Self-pay | Admitting: Internal Medicine

## 2012-05-23 DIAGNOSIS — D649 Anemia, unspecified: Secondary | ICD-10-CM

## 2012-05-23 DIAGNOSIS — D539 Nutritional anemia, unspecified: Secondary | ICD-10-CM

## 2012-05-31 ENCOUNTER — Other Ambulatory Visit (HOSPITAL_BASED_OUTPATIENT_CLINIC_OR_DEPARTMENT_OTHER): Payer: BC Managed Care – PPO | Admitting: Lab

## 2012-05-31 ENCOUNTER — Ambulatory Visit (HOSPITAL_BASED_OUTPATIENT_CLINIC_OR_DEPARTMENT_OTHER): Payer: BC Managed Care – PPO | Admitting: Internal Medicine

## 2012-05-31 ENCOUNTER — Encounter: Payer: Self-pay | Admitting: Internal Medicine

## 2012-05-31 ENCOUNTER — Telehealth: Payer: Self-pay | Admitting: Internal Medicine

## 2012-05-31 VITALS — BP 131/88 | HR 79 | Temp 98.3°F | Resp 20 | Ht 65.0 in | Wt 171.7 lb

## 2012-05-31 DIAGNOSIS — D649 Anemia, unspecified: Secondary | ICD-10-CM

## 2012-05-31 DIAGNOSIS — D509 Iron deficiency anemia, unspecified: Secondary | ICD-10-CM

## 2012-05-31 DIAGNOSIS — D539 Nutritional anemia, unspecified: Secondary | ICD-10-CM

## 2012-05-31 LAB — CBC WITH DIFFERENTIAL/PLATELET
BASO%: 0.5 % (ref 0.0–2.0)
Basophils Absolute: 0 10*3/uL (ref 0.0–0.1)
EOS%: 1.7 % (ref 0.0–7.0)
Eosinophils Absolute: 0.1 10*3/uL (ref 0.0–0.5)
HCT: 30.5 % — ABNORMAL LOW (ref 38.4–49.9)
HGB: 8.9 g/dL — ABNORMAL LOW (ref 13.0–17.1)
LYMPH%: 23.7 % (ref 14.0–49.0)
MCH: 22.3 pg — ABNORMAL LOW (ref 27.2–33.4)
MCHC: 29.2 g/dL — ABNORMAL LOW (ref 32.0–36.0)
MCV: 76.3 fL — ABNORMAL LOW (ref 79.3–98.0)
MONO#: 0.4 10*3/uL (ref 0.1–0.9)
MONO%: 6.4 % (ref 0.0–14.0)
NEUT#: 4.3 10*3/uL (ref 1.5–6.5)
NEUT%: 67.7 % (ref 39.0–75.0)
Platelets: 540 10*3/uL — ABNORMAL HIGH (ref 140–400)
RBC: 4 10*6/uL — ABNORMAL LOW (ref 4.20–5.82)
RDW: 21 % — ABNORMAL HIGH (ref 11.0–14.6)
WBC: 6.4 10*3/uL (ref 4.0–10.3)
lymph#: 1.5 10*3/uL (ref 0.9–3.3)
nRBC: 0 % (ref 0–0)

## 2012-05-31 NOTE — Patient Instructions (Signed)
You have mild improvement in your anemia after starting Integra plus. We discussed treatment with intravenous iron infusion and he would like to proceed with treatment. Followup in 2 months with repeat iron study.

## 2012-05-31 NOTE — Telephone Encounter (Signed)
gv and printed pt appt for April....emailed michelle to add iron...will call pt with time of appt

## 2012-05-31 NOTE — Progress Notes (Signed)
[facility] Telephone:(336) [phone]   Fax:(336) [phone]  OFFICE PROGRESS NOTE  Provider Not In System No address on file  DIAGNOSIS: Iron deficiency anemia  PRIOR THERAPY: None  CURRENT THERAPY: Integra Plus 1 capsule by mouth daily.  INTERVAL HISTORY: Caleb Foley 47 y.o. male returns to the clinic today for followup visit. The patient starts feeling a little bit better especially after starting the Integra plus. He has more energy and less shortness of breath. He is rating Integra plus fairly well with no significant adverse effects. The patient had several studies for evaluation of his anemia including fecal Hemoccult to that was negative x3, hemoglobin electrophoresis showed normal pattern. Serum protein electrophoreses was normal. Erythropoietin level was elevated at 256. Serum iron was less than 10 and ferritin level was 1. The patient has normal vitamin B 12 and folate level. Transferrin receptor was elevated at 4.99. The patient is here today for evaluation and discussion of his lab results and treatment options. He denied having any significant chest pain, shortness breath, cough or hemoptysis. He has no significant weight loss or night sweats.  MEDICAL HISTORY: Past Medical History  Diagnosis Date  . Umbilical hernia   . Arthritis   . Chronic back pain   . G6PD deficiency anemia     ALLERGIES:   has no known allergies.  MEDICATIONS:  Current Outpatient Prescriptions  Medication Sig Dispense Refill  . FeFum-FePoly-FA-B Cmp-C-Biot (INTEGRA PLUS) CAPS Take 1 capsule by mouth every morning.  30 capsule  1  . ibuprofen (ADVIL,MOTRIN) 600 MG tablet Take 600 mg by mouth every 8 (eight) hours as needed. For pain      . traMADol (ULTRAM) 50 MG tablet Take 50 mg by mouth every 8 (eight) hours as needed. For pain        SURGICAL HISTORY:  Past Surgical History  Procedure Date  . Hernia repair 04/30/11    umb hernia    REVIEW OF SYSTEMS:  A  comprehensive review of systems was negative except for: Constitutional: positive for fatigue   PHYSICAL EXAMINATION: General appearance: alert, cooperative, fatigued and no distress Head: Normocephalic, without obvious abnormality, atraumatic Neck: no adenopathy Lymph nodes: Cervical, supraclavicular, and axillary nodes normal. Resp: clear to auscultation bilaterally Cardio: regular rate and rhythm, S1, S2 normal, no murmur, click, rub or gallop GI: soft, non-tender; bowel sounds normal; no masses,  no organomegaly Extremities: extremities normal, atraumatic, no cyanosis or edema  ECOG PERFORMANCE STATUS: 1 - Symptomatic but completely ambulatory  There were no vitals taken for this visit.  LABORATORY DATA: Lab Results  Component Value Date   WBC 6.4 05/31/2012   HGB 8.9* 05/31/2012   HCT 30.5* 05/31/2012   MCV 76.3* 05/31/2012   PLT 540* 05/31/2012      Chemistry      Component Value Date/Time   NA 140 05/16/2012 1020   NA 137 05/09/2012 0829   K 4.5 05/16/2012 1020   K 4.0 05/09/2012 0829   CL 107 05/16/2012 1020   CL 101 05/09/2012 0829   CO2 25 05/16/2012 1020   CO2 26 05/09/2012 0829   BUN 11.0 05/16/2012 1020   BUN 14 05/09/2012 0829   CREATININE 0.9 05/16/2012 1020   CREATININE 0.93 05/09/2012 0829      Component Value Date/Time   CALCIUM 9.4 05/16/2012 1020   CALCIUM 9.7 05/09/2012 0829   ALKPHOS 111 05/16/2012 1020   AST 10 05/16/2012 1020   ALT 16 05/16/2012 1020  BILITOT 0.29 05/16/2012 1020       RADIOGRAPHIC STUDIES: Mr Lumbar Spine Wo Contrast  05/03/2012  *RADIOLOGY REPORT*  Clinical Data: Back and right leg pain.  Right leg weakness.  MRI LUMBAR SPINE WITHOUT CONTRAST  Technique:  Multiplanar and multiecho pulse sequences of the lumbar spine were obtained without intravenous contrast.  Comparison: Lumbar spine series 04/26/2012.  Findings: The sagittal MR images demonstrate normal alignment of the lumbar vertebral bodies.  Demonstrate normal marrow  signal. The facets are normally aligned.  No pars defects.  The last full intervertebral disc space is labeled L5-S1 and the conus medullaris terminates at T12.  There is partial sacralization of L5 on the plain films.  No significant paraspinal or retroperitoneal process.  L1-2:  No significant findings.  L2-3:  No significant findings.  L3-4:  Annular rent and a shallow central disc protrusion with mild impression on the ventral thecal sac slightly asymmetric to the right.  There is also moderate facet disease with mild bilateral lateral recess stenosis.  No foraminal stenosis.  L4-5:  Moderate to large sized disc extrusion asymmetric right with significant mass effect on the thecal sac and on the right L5 nerve root.  No foraminal stenosis.  Moderate facet disease.  L5-S1:  No significant findings.  IMPRESSION:  1.  Large right-sided disc extrusion at L4-5 likely accounting for the patient's right leg symptoms. 2.  Small central disc protrusion at L3-4.   Original Report Authenticated By: Rudie Meyer, M.D.     ASSESSMENT: This is a very pleasant 47 years old [place] American male with iron deficiency anemia of unclear etiology at this point and no evidence for GI bleed. The patient responded very well to treatment with Integra plus but still has anemia.   PLAN: I have a lengthy discussion with the patient today about his condition and treatment options. I gave him the option of continuous treatment with Integra plus versus proceeding with intravenous iron infusion. The patient has been off work for the few weeks and was unable to proceed with his back surgery because of the anemia. He preferred to proceed with intravenous iron infusion with the hope to improve his anemia quickly so he can proceed with his surgery and return back to work.  I will arrange for the patient Feraheme infusion 510 mg IV on January 17 and 06/10/2012. He would come back for followup visit in 2 months for reevaluation with repeat  CBC and iron study. I expect his hemoglobin to improve significantly in the next few weeks and the patient may be able to proceed with his surgery as scheduled. I may also consider referring the patient to gastroenterology for consideration of colonoscopy and rule out any GI bleed as the source of his iron deficiency anemia. The patient agreed to the current plan.  All questions were answered. The patient knows to call the clinic with any problems, questions or concerns. We can certainly see the patient much sooner if necessary.  I spent 15 minutes counseling the patient face to face. The total time spent in the appointment was 25 minutes.

## 2012-06-01 ENCOUNTER — Telehealth: Payer: Self-pay | Admitting: *Deleted

## 2012-06-01 ENCOUNTER — Telehealth: Payer: Self-pay | Admitting: Internal Medicine

## 2012-06-01 ENCOUNTER — Other Ambulatory Visit: Payer: Self-pay | Admitting: Neurosurgery

## 2012-06-01 NOTE — Telephone Encounter (Signed)
s.w. pt and advised on tx d/t for 1.17 and 1.24...[name] Kitchenpt ok

## 2012-06-01 NOTE — Telephone Encounter (Signed)
Per staff message and POF I have scheduled appts.  JMW  

## 2012-06-03 ENCOUNTER — Ambulatory Visit (HOSPITAL_BASED_OUTPATIENT_CLINIC_OR_DEPARTMENT_OTHER): Payer: BC Managed Care – PPO

## 2012-06-03 VITALS — BP 120/85 | HR 65 | Temp 97.3°F

## 2012-06-03 DIAGNOSIS — D649 Anemia, unspecified: Secondary | ICD-10-CM

## 2012-06-03 DIAGNOSIS — D509 Iron deficiency anemia, unspecified: Secondary | ICD-10-CM

## 2012-06-03 MED ORDER — SODIUM CHLORIDE 0.9 % IV SOLN
Freq: Once | INTRAVENOUS | Status: AC
  Administered 2012-06-03: 10:00:00 via INTRAVENOUS

## 2012-06-03 MED ORDER — FERUMOXYTOL INJECTION 510 MG/17 ML
510.0000 mg | Freq: Once | INTRAVENOUS | Status: AC
  Administered 2012-06-03: 510 mg via INTRAVENOUS
  Filled 2012-06-03: qty 17

## 2012-06-03 NOTE — Progress Notes (Signed)
Pt observed for 30 minutes post Feraheme.  No complaints.  Discharged home.

## 2012-06-03 NOTE — Patient Instructions (Signed)
Iron Deficiency Anemia  Anemia is when you have a low number of healthy red blood cells. HOME CARE   Ask your doctor or dietician what foods you should eat.  Take iron and vitamins as told by your doctor.  Eat foods that have iron in them. This includes liver, lean beef, whole-grain bread, eggs, dried fruit, and dark green leafy vegetables. GET HELP RIGHT AWAY IF:  You pass out (faint).  You have chest pain.  You feel sick to your stomach (nauseous) or throw up (vomit).  You get very short of breath with activity.  You are weak.  You are thirstier than normal.  You have a fast heartbeat.  You start to sweat or become lightheaded when getting up from a chair or bed. MAKE SURE YOU:  Understand these instructions.  Will watch your condition.  Will get help right away if you are not doing well or get worse. Document Released: 06/06/2010 Document Revised: 07/27/2011 Document Reviewed: 06/06/2010 [facility] Patient Information 2013 [place], [place].

## 2012-06-06 ENCOUNTER — Encounter (HOSPITAL_COMMUNITY): Payer: Self-pay | Admitting: Pharmacy Technician

## 2012-06-10 ENCOUNTER — Ambulatory Visit (HOSPITAL_BASED_OUTPATIENT_CLINIC_OR_DEPARTMENT_OTHER): Payer: BC Managed Care – PPO

## 2012-06-10 VITALS — BP 124/90 | HR 91 | Temp 98.3°F | Resp 20

## 2012-06-10 DIAGNOSIS — D649 Anemia, unspecified: Secondary | ICD-10-CM

## 2012-06-10 DIAGNOSIS — D509 Iron deficiency anemia, unspecified: Secondary | ICD-10-CM

## 2012-06-10 MED ORDER — SODIUM CHLORIDE 0.9 % IV SOLN
Freq: Once | INTRAVENOUS | Status: AC
  Administered 2012-06-10: 09:00:00 via INTRAVENOUS

## 2012-06-10 MED ORDER — FERUMOXYTOL INJECTION 510 MG/17 ML
510.0000 mg | Freq: Once | INTRAVENOUS | Status: AC
  Administered 2012-06-10: 510 mg via INTRAVENOUS
  Filled 2012-06-10: qty 17

## 2012-06-10 NOTE — Progress Notes (Signed)
Good blood return noted, IV dripping to gravity. Patient tolerated well.

## 2012-06-10 NOTE — Patient Instructions (Signed)
Ferumoxytol injection What is this medicine? FERUMOXYTOL is an iron complex. Iron is used to make healthy red blood cells, which carry oxygen and nutrients throughout the body. This medicine is used to treat iron deficiency anemia in people with chronic kidney disease. This medicine may be used for other purposes; ask your health care provider or pharmacist if you have questions. What should I tell my health care provider before I take this medicine? They need to know if you have any of these conditions: -anemia not caused by low iron levels -high levels of iron in the blood -magnetic resonance imaging (MRI) test scheduled -an unusual or allergic reaction to iron, other medicines, foods, dyes, or preservatives -pregnant or trying to get pregnant -breast-feeding How should I use this medicine? This medicine is for infusion into a vein. It is given by a health care professional in a hospital or clinic setting. Talk to your pediatrician regarding the use of this medicine in children. Special care may be needed. Overdosage: If you think you've taken too much of this medicine contact a poison control center or emergency room at once. Overdosage: If you think you have taken too much of this medicine contact a poison control center or emergency room at once. NOTE: This medicine is only for you. Do not share this medicine with others. What if I miss a dose? It is important not to miss your dose. Call your doctor or health care professional if you are unable to keep an appointment. What may interact with this medicine? This medicine may interact with the following medications: -other iron products This list may not describe all possible interactions. Give your health care provider a list of all the medicines, herbs, non-prescription drugs, or dietary supplements you use. Also tell them if you smoke, drink alcohol, or use illegal drugs. Some items may interact with your medicine. What should I watch  for while using this medicine? Visit your doctor or healthcare professional regularly. Tell your doctor or healthcare professional if your symptoms do not start to get better or if they get worse. You may need blood work done while you are taking this medicine. You may need to follow a special diet. Talk to your doctor. Foods that contain iron include: whole grains/cereals, dried fruits, beans, or peas, leafy green vegetables, and organ meats (liver, kidney). What side effects may I notice from receiving this medicine? Side effects that you should report to your doctor or health care professional as soon as possible: -allergic reactions like skin rash, itching or hives, swelling of the face, lips, or tongue -breathing problems -changes in blood pressure -feeling faint or lightheaded, falls -fever or chills -flushing, sweating, or hot feelings -swelling of the ankles or feet Side effects that usually do not require medical attention (Report these to your doctor or health care professional if they continue or are bothersome.): -diarrhea -headache -nausea, vomiting -stomach pain This list may not describe all possible side effects. Call your doctor for medical advice about side effects. You may report side effects to FDA at 1-800-FDA-1088. Where should I keep my medicine? This drug is given in a hospital or clinic and will not be stored at home. NOTE: This sheet is a summary. It may not cover all possible information. If you have questions about this medicine, talk to your doctor, pharmacist, or health care provider.  2012, [employer]vier/Gold Standard. (01/25/2008 9:48:25 PM) 

## 2012-06-11 NOTE — Pre-Procedure Instructions (Addendum)
Caleb Foley  06/11/2012   Your procedure is scheduled on:  Friday, January 31st   Report to [facility] at 5:30 AM.  Call this number if you have problems the morning of surgery: [phone]   Remember:   Do not eat food or drink liquids after midnight Thursday.    Take these medicines the morning of surgery with A SIP OF WATER: Tramadol STOP ibuprofen ,herbal meds, aspirin, blood thinners   Do not wear jewelry/  Do not wear lotions, powders, or colognes.  You may NOT wear deodorant.   Men may shave face and neck.   Do not bring valuables to the hospital.  Contacts, dentures or bridgework may not be worn into surgery.   Leave suitcase in the car. After surgery it may be brought to your room.  For patients admitted to the hospital, checkout time is 11:00 AM the day of discharge.   Patients discharged the day of surgery will not be allowed to drive home, and will need someone to stay              With them for the first 24 hours after.   Name and phone number of your driver: shirley driver [phone] mom   Special Instructions: Shower using CHG 2 nights before surgery and the night before surgery.  If you shower the day of surgery use CHG.  Use special wash - you have one bottle of CHG for all showers.  You should use approximately 1/3 of the bottle for each shower.   Please read over the following fact sheets that you were given: Pain Booklet, Coughing and Deep Breathing, MRSA Information and Surgical Site Infection Prevention

## 2012-06-13 ENCOUNTER — Encounter (HOSPITAL_COMMUNITY): Payer: Self-pay

## 2012-06-13 ENCOUNTER — Encounter (HOSPITAL_COMMUNITY)
Admission: RE | Admit: 2012-06-13 | Discharge: 2012-06-13 | Disposition: A | Discharge status: Home / Self Care | Payer: BC Managed Care – PPO | Source: Ambulatory Visit | Attending: Neurosurgery | Admitting: Neurosurgery

## 2012-06-13 LAB — CBC
HCT: 36.9 % — ABNORMAL LOW (ref 39.0–52.0)
Hemoglobin: 11.2 g/dL — ABNORMAL LOW (ref 13.0–17.0)
MCV: 78 fL (ref 78.0–100.0)
RBC: 4.73 MIL/uL (ref 4.22–5.81)
RDW: 22.6 % — ABNORMAL HIGH (ref 11.5–15.5)
WBC: 4.5 10*3/uL (ref 4.0–10.5)

## 2012-06-13 LAB — SURGICAL PCR SCREEN: MRSA, PCR: NEGATIVE

## 2012-06-16 MED ORDER — CEFAZOLIN SODIUM-DEXTROSE 2-3 GM-% IV SOLR
2.0000 g | INTRAVENOUS | Status: AC
  Administered 2012-06-17: 2 g via INTRAVENOUS
  Filled 2012-06-16: qty 50

## 2012-06-17 ENCOUNTER — Encounter (HOSPITAL_COMMUNITY): Payer: Self-pay | Admitting: Anesthesiology

## 2012-06-17 ENCOUNTER — Encounter (HOSPITAL_COMMUNITY): Payer: Self-pay | Admitting: *Deleted

## 2012-06-17 ENCOUNTER — Ambulatory Visit (HOSPITAL_COMMUNITY): Payer: BC Managed Care – PPO | Admitting: Anesthesiology

## 2012-06-17 ENCOUNTER — Encounter (HOSPITAL_COMMUNITY)
Admission: RE | Disposition: A | Discharge status: Home / Self Care | Payer: Self-pay | Source: Ambulatory Visit | Attending: Neurosurgery

## 2012-06-17 ENCOUNTER — Ambulatory Visit (HOSPITAL_COMMUNITY): Payer: BC Managed Care – PPO

## 2012-06-17 ENCOUNTER — Ambulatory Visit (HOSPITAL_COMMUNITY)
Admission: RE | Admit: 2012-06-17 | Discharge: 2012-06-17 | Disposition: A | Discharge status: Home / Self Care | Payer: BC Managed Care – PPO | Source: Ambulatory Visit | Attending: Neurosurgery | Admitting: Neurosurgery

## 2012-06-17 DIAGNOSIS — D539 Nutritional anemia, unspecified: Secondary | ICD-10-CM

## 2012-06-17 DIAGNOSIS — F17200 Nicotine dependence, unspecified, uncomplicated: Secondary | ICD-10-CM | POA: Insufficient documentation

## 2012-06-17 DIAGNOSIS — M5126 Other intervertebral disc displacement, lumbar region: Secondary | ICD-10-CM | POA: Insufficient documentation

## 2012-06-17 DIAGNOSIS — Z01812 Encounter for preprocedural laboratory examination: Secondary | ICD-10-CM | POA: Insufficient documentation

## 2012-06-17 DIAGNOSIS — D649 Anemia, unspecified: Secondary | ICD-10-CM

## 2012-06-17 HISTORY — PX: LUMBAR LAMINECTOMY/DECOMPRESSION MICRODISCECTOMY: SHX5026

## 2012-06-17 SURGERY — LUMBAR LAMINECTOMY/DECOMPRESSION MICRODISCECTOMY 1 LEVEL
Anesthesia: General | Site: Spine Lumbar | Laterality: Right | Wound class: Clean

## 2012-06-17 MED ORDER — OXYCODONE HCL 5 MG PO TABS: 5.0000 mg | ORAL_TABLET | ORAL | Status: DC | PRN

## 2012-06-17 MED ORDER — SODIUM CHLORIDE 0.9 % IJ SOLN: 3.0000 mL | INTRAMUSCULAR | Status: DC | PRN

## 2012-06-17 MED ORDER — BUPIVACAINE LIPOSOME 1.3 % IJ SUSP
INTRAMUSCULAR | Status: DC | PRN
  Administered 2012-06-17: 20 mL

## 2012-06-17 MED ORDER — MENTHOL 3 MG MT LOZG: 1.0000 | LOZENGE | OROMUCOSAL | Status: DC | PRN

## 2012-06-17 MED ORDER — MIDAZOLAM HCL 5 MG/5ML IJ SOLN
INTRAMUSCULAR | Status: DC | PRN
  Administered 2012-06-17: 2 mg via INTRAVENOUS

## 2012-06-17 MED ORDER — NEOSTIGMINE METHYLSULFATE 1 MG/ML IJ SOLN
INTRAMUSCULAR | Status: DC | PRN
  Administered 2012-06-17: 3 mg via INTRAVENOUS

## 2012-06-17 MED ORDER — FENTANYL CITRATE 0.05 MG/ML IJ SOLN
INTRAMUSCULAR | Status: DC | PRN
  Administered 2012-06-17: 50 ug via INTRAVENOUS
  Administered 2012-06-17: 100 ug via INTRAVENOUS

## 2012-06-17 MED ORDER — LACTATED RINGERS IV SOLN
INTRAVENOUS | Status: DC | PRN
  Administered 2012-06-17 (×2): via INTRAVENOUS

## 2012-06-17 MED ORDER — ROCURONIUM BROMIDE 100 MG/10ML IV SOLN
INTRAVENOUS | Status: DC | PRN
  Administered 2012-06-17: 50 mg via INTRAVENOUS

## 2012-06-17 MED ORDER — ARTIFICIAL TEARS OP OINT
TOPICAL_OINTMENT | OPHTHALMIC | Status: DC | PRN
  Administered 2012-06-17: 1 via OPHTHALMIC

## 2012-06-17 MED ORDER — THROMBIN 5000 UNITS EX KIT
PACK | CUTANEOUS | Status: DC | PRN
  Administered 2012-06-17 (×2): 5000 [IU] via TOPICAL

## 2012-06-17 MED ORDER — BUPIVACAINE LIPOSOME 1.3 % IJ SUSP
20.0000 mL | Freq: Once | INTRAMUSCULAR | Status: DC
  Filled 2012-06-17: qty 20

## 2012-06-17 MED ORDER — FE FUMARATE-B12-VIT C-FA-IFC PO CAPS
1.0000 | ORAL_CAPSULE | Freq: Every day | ORAL | Status: DC
Start: 2012-06-17 — End: 2012-06-17
  Administered 2012-06-17: 1 via ORAL
  Filled 2012-06-17: qty 1

## 2012-06-17 MED ORDER — METHYLPREDNISOLONE ACETATE 80 MG/ML IJ SUSP
INTRAMUSCULAR | Status: DC | PRN
  Administered 2012-06-17: 80 mg

## 2012-06-17 MED ORDER — ONDANSETRON HCL 4 MG/2ML IJ SOLN: 4.0000 mg | INTRAMUSCULAR | Status: DC | PRN

## 2012-06-17 MED ORDER — HEMOSTATIC AGENTS (NO CHARGE) OPTIME
TOPICAL | Status: DC | PRN
  Administered 2012-06-17: 1 via TOPICAL

## 2012-06-17 MED ORDER — HYDROCODONE-ACETAMINOPHEN 5-325 MG PO TABS
1.0000 | ORAL_TABLET | ORAL | Status: DC | PRN
  Administered 2012-06-17: 2 via ORAL

## 2012-06-17 MED ORDER — DIAZEPAM 5 MG PO TABS
5.0000 mg | ORAL_TABLET | Freq: Four times a day (QID) | ORAL | Status: DC | PRN
  Administered 2012-06-17: 5 mg via ORAL

## 2012-06-17 MED ORDER — GLYCOPYRROLATE 0.2 MG/ML IJ SOLN
INTRAMUSCULAR | Status: DC | PRN
  Administered 2012-06-17: 0.4 mg via INTRAVENOUS

## 2012-06-17 MED ORDER — LIDOCAINE-EPINEPHRINE 0.5 %-1:200000 IJ SOLN
INTRAMUSCULAR | Status: DC | PRN
  Administered 2012-06-17: 10 mL

## 2012-06-17 MED ORDER — LIDOCAINE HCL (CARDIAC) 20 MG/ML IV SOLN
INTRAVENOUS | Status: DC | PRN
  Administered 2012-06-17: 60 mg via INTRAVENOUS

## 2012-06-17 MED ORDER — POTASSIUM CHLORIDE IN NACL 20-0.9 MEQ/L-% IV SOLN
INTRAVENOUS | Status: DC
  Filled 2012-06-17 (×2): qty 1000

## 2012-06-17 MED ORDER — SENNOSIDES-DOCUSATE SODIUM 8.6-50 MG PO TABS
1.0000 | ORAL_TABLET | Freq: Every evening | ORAL | Status: DC | PRN
  Filled 2012-06-17: qty 1

## 2012-06-17 MED ORDER — ONDANSETRON HCL 4 MG/2ML IJ SOLN
INTRAMUSCULAR | Status: DC | PRN
  Administered 2012-06-17: 4 mg via INTRAVENOUS

## 2012-06-17 MED ORDER — 0.9 % SODIUM CHLORIDE (POUR BTL) OPTIME
TOPICAL | Status: DC | PRN
  Administered 2012-06-17: 1000 mL

## 2012-06-17 MED ORDER — SENNA 8.6 MG PO TABS: 1.0000 | ORAL_TABLET | Freq: Two times a day (BID) | ORAL | Status: DC

## 2012-06-17 MED ORDER — ACETAMINOPHEN 10 MG/ML IV SOLN
1000.0000 mg | Freq: Four times a day (QID) | INTRAVENOUS | Status: DC
  Administered 2012-06-17: 1000 mg via INTRAVENOUS
  Filled 2012-06-17 (×2): qty 100

## 2012-06-17 MED ORDER — SODIUM CHLORIDE 0.9 % IJ SOLN
3.0000 mL | Freq: Two times a day (BID) | INTRAMUSCULAR | Status: DC
  Administered 2012-06-17: 3 mL via INTRAVENOUS

## 2012-06-17 MED ORDER — KETOROLAC TROMETHAMINE 30 MG/ML IJ SOLN
30.0000 mg | Freq: Four times a day (QID) | INTRAMUSCULAR | Status: DC
  Administered 2012-06-17 (×2): 30 mg via INTRAVENOUS

## 2012-06-17 MED ORDER — MORPHINE SULFATE 2 MG/ML IJ SOLN: 1.0000 mg | INTRAMUSCULAR | Status: DC | PRN

## 2012-06-17 MED ORDER — INTEGRA PLUS PO CAPS: 1.0000 | ORAL_CAPSULE | Freq: Every morning | ORAL | Status: DC

## 2012-06-17 MED ORDER — PROPOFOL 10 MG/ML IV BOLUS
INTRAVENOUS | Status: DC | PRN
  Administered 2012-06-17: 50 mg via INTRAVENOUS
  Administered 2012-06-17: 200 mg via INTRAVENOUS

## 2012-06-17 MED ORDER — PHENOL 1.4 % MT LIQD: 1.0000 | OROMUCOSAL | Status: DC | PRN

## 2012-06-17 MED ORDER — HYDROMORPHONE HCL PF 1 MG/ML IJ SOLN: 0.2500 mg | INTRAMUSCULAR | Status: DC | PRN

## 2012-06-17 SURGICAL SUPPLY — 60 items
BAG DECANTER FOR FLEXI CONT (MISCELLANEOUS) IMPLANT
BENZOIN TINCTURE PRP APPL 2/3 (GAUZE/BANDAGES/DRESSINGS) IMPLANT
BLADE SURG ROTATE 9660 (MISCELLANEOUS) IMPLANT
BUR MATCHSTICK NEURO 3.0 LAGG (BURR) ×2 IMPLANT
CANISTER SUCTION 2500CC (MISCELLANEOUS) ×2 IMPLANT
CLOTH BEACON ORANGE TIMEOUT ST (SAFETY) ×2 IMPLANT
CONT SPEC 4OZ CLIKSEAL STRL BL (MISCELLANEOUS) ×2 IMPLANT
DECANTER SPIKE VIAL GLASS SM (MISCELLANEOUS) ×2 IMPLANT
DERMABOND ADHESIVE PROPEN (GAUZE/BANDAGES/DRESSINGS) ×1
DERMABOND ADVANCED (GAUZE/BANDAGES/DRESSINGS)
DERMABOND ADVANCED .7 DNX12 (GAUZE/BANDAGES/DRESSINGS) IMPLANT
DERMABOND ADVANCED .7 DNX6 (GAUZE/BANDAGES/DRESSINGS) ×1 IMPLANT
DRAPE LAPAROTOMY 100X72X124 (DRAPES) ×2 IMPLANT
DRAPE MICROSCOPE LEICA (MISCELLANEOUS) ×2 IMPLANT
DRAPE POUCH INSTRU U-SHP 10X18 (DRAPES) ×2 IMPLANT
DRAPE SURG 17X23 STRL (DRAPES) ×2 IMPLANT
DURAPREP 26ML APPLICATOR (WOUND CARE) ×2 IMPLANT
ELECT REM PT RETURN 9FT ADLT (ELECTROSURGICAL) ×2
ELECTRODE REM PT RTRN 9FT ADLT (ELECTROSURGICAL) ×1 IMPLANT
GAUZE SPONGE 4X4 16PLY XRAY LF (GAUZE/BANDAGES/DRESSINGS) IMPLANT
GLOVE BIOGEL PI IND STRL 7.0 (GLOVE) ×1 IMPLANT
GLOVE BIOGEL PI IND STRL 8.5 (GLOVE) ×3 IMPLANT
GLOVE BIOGEL PI INDICATOR 7.0 (GLOVE) ×1
GLOVE BIOGEL PI INDICATOR 8.5 (GLOVE) ×3
GLOVE ECLIPSE 6.5 STRL STRAW (GLOVE) ×2 IMPLANT
GLOVE ECLIPSE 8.5 STRL (GLOVE) ×2 IMPLANT
GLOVE EXAM NITRILE LRG STRL (GLOVE) IMPLANT
GLOVE EXAM NITRILE MD LF STRL (GLOVE) IMPLANT
GLOVE EXAM NITRILE XL STR (GLOVE) IMPLANT
GLOVE EXAM NITRILE XS STR PU (GLOVE) IMPLANT
GLOVE SS BIOGEL STRL SZ 6.5 (GLOVE) ×1 IMPLANT
GLOVE SUPERSENSE BIOGEL SZ 6.5 (GLOVE) ×1
GLOVE SURG SS PI 8.0 STRL IVOR (GLOVE) ×8 IMPLANT
GOWN BRE IMP SLV AUR LG STRL (GOWN DISPOSABLE) ×4 IMPLANT
GOWN BRE IMP SLV AUR XL STRL (GOWN DISPOSABLE) IMPLANT
GOWN STRL REIN 2XL LVL4 (GOWN DISPOSABLE) ×6 IMPLANT
KIT BASIN OR (CUSTOM PROCEDURE TRAY) ×2 IMPLANT
KIT ROOM TURNOVER OR (KITS) ×2 IMPLANT
NEEDLE BLUNT 18X1 FOR OR ONLY (NEEDLE) ×2 IMPLANT
NEEDLE HYPO 21X1.5 SAFETY (NEEDLE) ×2 IMPLANT
NEEDLE HYPO 25X1 1.5 SAFETY (NEEDLE) ×2 IMPLANT
NEEDLE SPNL 18GX3.5 QUINCKE PK (NEEDLE) ×2 IMPLANT
NS IRRIG 1000ML POUR BTL (IV SOLUTION) ×2 IMPLANT
PACK LAMINECTOMY NEURO (CUSTOM PROCEDURE TRAY) ×2 IMPLANT
PAD ARMBOARD 7.5X6 YLW CONV (MISCELLANEOUS) ×10 IMPLANT
RUBBERBAND STERILE (MISCELLANEOUS) ×4 IMPLANT
SPONGE GAUZE 4X4 12PLY (GAUZE/BANDAGES/DRESSINGS) IMPLANT
SPONGE LAP 4X18 X RAY DECT (DISPOSABLE) IMPLANT
SPONGE SURGIFOAM ABS GEL SZ50 (HEMOSTASIS) ×2 IMPLANT
STRIP CLOSURE SKIN 1/2X4 (GAUZE/BANDAGES/DRESSINGS) IMPLANT
SUT VIC AB 0 CT1 18XCR BRD8 (SUTURE) ×1 IMPLANT
SUT VIC AB 0 CT1 8-18 (SUTURE) ×1
SUT VIC AB 2-0 CT1 18 (SUTURE) ×2 IMPLANT
SUT VIC AB 3-0 SH 8-18 (SUTURE) ×2 IMPLANT
SYR 20CC LL (SYRINGE) ×2 IMPLANT
SYR 20ML ECCENTRIC (SYRINGE) ×2 IMPLANT
SYR 3ML LL SCALE MARK (SYRINGE) ×2 IMPLANT
TOWEL OR 17X24 6PK STRL BLUE (TOWEL DISPOSABLE) ×2 IMPLANT
TOWEL OR 17X26 10 PK STRL BLUE (TOWEL DISPOSABLE) ×2 IMPLANT
WATER STERILE IRR 1000ML POUR (IV SOLUTION) ×2 IMPLANT

## 2012-06-17 NOTE — Discharge Summary (Signed)
Physician Discharge Summary  Patient ID: Caleb Foley MRN: [id number] DOB/AGE: 1966-02-23 47 y.o.  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 *    Discharged Condition: good  Hospital Course: Mr. Caleb Foley presented to the hospital with severe pain in the right lower extremity. He underwent an uncomplicated lumbar laminectomy and discetomy this morning. Postoperatively he was voiding, ambulating, and tolerating a regular diet. His wound was clean, dry, and without signs of infection. He therefore was discharged.  Consults: None  Significant Diagnostic Studies: none  Treatments: surgery: as above  Discharge Exam: Blood pressure 135/87, pulse 58, temperature 97.6 F (36.4 C), temperature source Oral, resp. rate 20, SpO2 98.00%. General appearance: alert, cooperative, appears stated age and no distress Neurologic: Alert and oriented X 3, normal strength and tone. Normal symmetric reflexes. Normal coordination and gait  Disposition: 01-Home or Self Care  Discharge Orders    Future Appointments: Provider: Department: Dept Phone: Center:   08/30/2012 8:30 AM Dava Najjar [facility] MED[facility] [phone] None   08/30/2012 9:00 AM Si Gaul, MD [facility] MEDICAL ONCOLOGY [phone] None       Medication List     As of 06/17/2012  8:13 PM    TAKE these medications         HYDROcodone-acetaminophen 5-500 MG per tablet   Commonly known as: VICODIN   Take 1 tablet by mouth every 6 (six) hours as needed.      ibuprofen 600 MG tablet   Commonly known as: ADVIL,MOTRIN   Take 600 mg by mouth every 8 (eight) hours as needed. For pain      INTEGRA PLUS Caps   Take 1 capsule by mouth every morning.      traMADol 50 MG tablet   Commonly known as: ULTRAM   Take 50 mg by mouth every 8 (eight)  hours as needed. For pain           Follow-up Information    Follow up with Jammie Clink L, MD. (call to make appt)    Contact information:   [address]                         [address] [place] [address] [phone]          Signed: Zamyah Wiesman L 06/17/2012, 8:13 PM

## 2012-06-17 NOTE — Anesthesia Postprocedure Evaluation (Signed)
  Anesthesia Post-op Note  Patient: Caleb Foley  Procedure(s) Performed: Procedure(s) (LRB) with comments: LUMBAR LAMINECTOMY/DECOMPRESSION MICRODISCECTOMY 1 LEVEL (Right) - RIGHT Lumbar Four-five diskectomy  Patient Location: PACU  Anesthesia Type:General  Level of Consciousness: awake  Airway and Oxygen Therapy: Patient Spontanous Breathing  Post-op Pain: mild  Post-op Assessment: Post-op Vital signs reviewed  Post-op Vital Signs: Reviewed  Complications: No apparent anesthesia complications

## 2012-06-17 NOTE — Preoperative (Signed)
Beta Blockers   Reason not to administer Beta Blockers:Not Applicable 

## 2012-06-17 NOTE — Transfer of Care (Signed)
Immediate Anesthesia Transfer of Care Note  Patient: Caleb Foley  Procedure(s) Performed: Procedure(s) (LRB) with comments: LUMBAR LAMINECTOMY/DECOMPRESSION MICRODISCECTOMY 1 LEVEL (Right) - RIGHT Lumbar Four-five diskectomy  Patient Location: PACU  Anesthesia Type:General  Level of Consciousness: awake, alert , oriented and patient cooperative  Airway & Oxygen Therapy: Patient Spontanous Breathing and Patient connected to nasal cannula oxygen  Post-op Assessment: Report given to PACU RN, Post -op Vital signs reviewed and stable and Patient moving all extremities  Post vital signs: Reviewed and stable  Complications: No apparent anesthesia complications

## 2012-06-17 NOTE — Anesthesia Procedure Notes (Signed)
Procedure Name: Intubation Date/Time: 06/17/2012 8:39 AM Performed by: Jerilee Hoh Pre-anesthesia Checklist: Patient identified, Emergency Drugs available, Suction available and Patient being monitored Patient Re-evaluated:Patient Re-evaluated prior to inductionOxygen Delivery Method: Circle system utilized Preoxygenation: Pre-oxygenation with 100% oxygen Intubation Type: IV induction Ventilation: Mask ventilation without difficulty and Oral airway inserted - appropriate to patient size Laryngoscope Size: Mac and 4 Grade View: Grade II Tube type: Oral Tube size: 7.5 mm Number of attempts: 1 Airway Equipment and Method: Stylet Placement Confirmation: ETT inserted through vocal cords under direct vision,  positive ETCO2 and breath sounds checked- equal and bilateral Secured at: 22 cm Tube secured with: Tape Dental Injury: Teeth and Oropharynx as per pre-operative assessment

## 2012-06-17 NOTE — Plan of Care (Signed)
Problem: Consults Goal: Diagnosis - Spinal Surgery Outcome: Completed/Met Date Met:  06/17/12 Lumbar Laminectomy (Complex)

## 2012-06-17 NOTE — H&P (Signed)
BP 128/89  Pulse 70  Temp 98.1 F (36.7 C) (Oral)  Resp 20  SpO2 100% comes in today for evaluation of a large herniated disc on the right side at L4-5 and a history of pain in his right lower extremity over the last 2 months.  Mr. Boursiquot has never had pain like this in the past.  He says he was essentially tolerating it until his right toe started going numb near the beginning of December that led him to the physician's office.  He was given two rounds of oral steroids, which have not helped.  He says he has been off work now for the last week only because he found it very difficult to work.  Dr. Perrin Maltese felt it best that he not work.  He says he did try to work initially after seeing Dr. Perrin Maltese, but it just did not work out well.  He is a Naval architect and left-handed.    PAST MEDICAL HISTORY:  Otherwise good.    FAMILY HISTORY:    Provided no history on.    PAST SURGICAL HISTORY:  He has had an umbilical hernia.    DRUG ALLERGIES:    No known drug allergies.    SOCIAL HISTORY:    He does smoke two cigars a day.  He does have a history of drinking alcohol once weekly.  No history of substance abuse.  He is 164 cm. in height.  He weighs 169 lbs.  He has a pulse of 84 on examination today.    REVIEW OF SYSTEMS:   Positive for leg pain at rest and with walking.  He denies constitutional, eye, ear, nose, throat, mouth, respiratory, gastrointestinal, genitourinary, musculoskeletal, skin, neurological, psychiatric, endocrine, hematologic and allergic problems.    MEDICATIONS:    Medications are Cyclobenzaprine and Ibuprofen.  He has finished his Prednisone and Tramadol.    EXAMINATION:    On examination he is alert, oriented x 4 and answering all questions appropriately.  Minimal distress.  Gait is mildly antalgic favoring the right lower extremity.  2+ reflexes at the left knee, left ankle, biceps, triceps, brachioradialis, 1+ right knee, 1+ right ankle.  No clonus.  No Hoffmann's sign.   Proprioception intact in both upper  and lower extremities.  He can toe walk and heel walk.  Normal muscle tone, bulk and coordination.  Pupils are equal, round and reactive to light.  Full extraocular movements.  Full visual fields.  Hearing intact to finger rub bilaterally.  Uvula elevates in the midline.  Shoulder shrug is normal.  Tongue protrudes in the midline.  Sclera not injected.  No clubbing, cyanosis or edema.  No cervical masses or bruits.  Lung fields clear.  Heart regular rhythm and rate.  No murmurs or rubs.  Pulse is good at both wrists.  Head is normocephalic, atraumatic.    DIAGNOSTIC STUDIES:   MRI lumbar spine is reviewed and shows a normal conus medullaris and cauda equina.  He has a large herniated disc associated with a degenerated disc at L4-5 eccentric to the right side with significant compression of the thecal sac and right L5 root.  The rest of his spine actually looks fairly good.  Plain x-rays were reviewed.  He has some straightening of his lordosis on that film, but his alignment is otherwise normal.    DIAGNOSIS:     1.  Displaced disc L4-5 to the right.         2.  Right L5 radiculopathy.  SUMMARY:     Mr. Serena presents today with a 56-month history of conservative treatment, steroids and what he describes as weakness in his lower extremity.  At this point in time I think it very reasonable given the size of the disc herniation and after being offered epidural injections and rejected that we pursue operative decompression.  Risk and benefits, bleeding, infection, no relief, need for further surgery, disc recurrence, damage to the nerves causing one or both legs to be weak, footdrops, possibility of bowel or bladder dysfunction, sexual dysfunction all were discussed.  These were discussed along with other risks.  He understands and wishes to proceed.

## 2012-06-17 NOTE — Op Note (Signed)
06/17/2012  10:24 AM  PATIENT:  Caleb Foley  47 y.o. male with pain in the right lower extremity due to a herniated disc at L4/5 eccentric to the right   PRE-OPERATIVE DIAGNOSIS:  lumbar herniated disc lumbar radiculopathy right L4/5  POST-OPERATIVE DIAGNOSIS:  Lumbar Herniated Disc,Lumbar Radiculopathy right L4/5  PROCEDURE:  Procedure(s): right L4/5 LUMBAR LAMINECTOMY/DECOMPRESSION MICRODISCECTOMY 1 LEVEL Microdissection SURGEON:  Surgeon(s): Carmela Hurt, MD Barnett Abu, MD  ASSISTANTS:Elsner, Sherilyn Cooter  ANESTHESIA:   general  EBL:  Total I/O In: 1000 [I.V.:1000] Out: -   BLOOD ADMINISTERED:none  CELL SAVER GIVEN:none  COUNT:per nursing  DRAINS: none   SPECIMEN:  No Specimen  DICTATION: Antone Summons is a 47 y.o. male taken to the operating room intubated and placed under a general anesthetic without difficulty. He was positioned prone on a wilson frame with all pressure points padded. His back was prepped and draped in a sterile manner. I infiltrated 10cc lidocaine into the lumbar region in my planned incision. I opened the skin with a 10 blade and took the incision down to the thoracolumbar fascia. I opened a semicircular flap in the fascia and retracted it medially. I used the cobb perisoteal dissector to reflect the paraspinous musculature laterally. I confirmed the location with an intraoperative xray at L4/5. I used the drill and Kerrison punches to perform a semihemilaminectomy of L4. I then removed the ligamentum flavum and exposed the thecal sac. I brought the microscope into the operative field and with microdissection retracted the thecal sac medially exposing the disc herniation. It was quite large and somewhat more midline than I thought. I opened the disc space with a 15 blade and removed disc with Dr. Chales Salmon assistance. We were able with microdissection to remove disc and decompress the L5 root on the right side. We did remove a great deal of clearly  degenerated disc material. We inspected the nerve root in all directions and felt it was decompressed. I irrigated the wound. I closed after placing depomedrol into the resection site. I closed in layers approximating the thoracolumbar flap, subcutaneous and subcuticular tissue with vicryl sutures. I used dermabond for a sterile dressing.    PLAN OF CARE: Admit for overnight observation  PATIENT DISPOSITION:  PACU - hemodynamically stable.   Delay start of Pharmacological VTE agent (>24hrs) due to surgical blood loss or risk of bleeding:  yes

## 2012-06-17 NOTE — Anesthesia Preprocedure Evaluation (Signed)
Anesthesia Evaluation  Patient identified by MRN, date of birth, ID band Patient awake    Reviewed: Allergy & Precautions, H&P , NPO status   Airway Mallampati: II      Dental   Pulmonary Current Smoker,  breath sounds clear to auscultation        Cardiovascular negative cardio ROS  Rhythm:Regular Rate:Normal     Neuro/Psych    GI/Hepatic negative GI ROS, Neg liver ROS,   Endo/Other  negative endocrine ROS  Renal/GU negative Renal ROS     Musculoskeletal   Abdominal   Peds  Hematology   Anesthesia Other Findings   Reproductive/Obstetrics                           Anesthesia Physical Anesthesia Plan  ASA: II  Anesthesia Plan: General   Post-op Pain Management:    Induction: Intravenous  Airway Management Planned: Oral ETT  Additional Equipment:   Intra-op Plan:   Post-operative Plan: Extubation in OR  Informed Consent: I have reviewed the patients History and Physical, chart, labs and discussed the procedure including the risks, benefits and alternatives for the proposed anesthesia with the patient or authorized representative who has indicated his/her understanding and acceptance.   Dental advisory given  Plan Discussed with: CRNA and Anesthesiologist  Anesthesia Plan Comments:         Anesthesia Quick Evaluation

## 2012-06-20 ENCOUNTER — Encounter (HOSPITAL_COMMUNITY): Payer: Self-pay | Admitting: Neurosurgery

## 2012-08-30 ENCOUNTER — Other Ambulatory Visit (HOSPITAL_BASED_OUTPATIENT_CLINIC_OR_DEPARTMENT_OTHER): Payer: BC Managed Care – PPO | Admitting: Lab

## 2012-08-30 ENCOUNTER — Ambulatory Visit (HOSPITAL_BASED_OUTPATIENT_CLINIC_OR_DEPARTMENT_OTHER): Payer: BC Managed Care – PPO | Admitting: Internal Medicine

## 2012-08-30 ENCOUNTER — Encounter: Payer: Self-pay | Admitting: Internal Medicine

## 2012-08-30 ENCOUNTER — Telehealth: Payer: Self-pay | Admitting: Internal Medicine

## 2012-08-30 VITALS — BP 141/96 | HR 65 | Temp 98.2°F | Resp 18 | Ht 65.0 in | Wt 170.7 lb

## 2012-08-30 DIAGNOSIS — D539 Nutritional anemia, unspecified: Secondary | ICD-10-CM

## 2012-08-30 DIAGNOSIS — D509 Iron deficiency anemia, unspecified: Secondary | ICD-10-CM

## 2012-08-30 DIAGNOSIS — D649 Anemia, unspecified: Secondary | ICD-10-CM

## 2012-08-30 LAB — CBC & DIFF AND RETIC
Basophils Absolute: 0 10*3/uL (ref 0.0–0.1)
Eosinophils Absolute: 0.3 10*3/uL (ref 0.0–0.5)
HGB: 12.7 g/dL — ABNORMAL LOW (ref 13.0–17.1)
Immature Retic Fract: 9.4 % (ref 3.00–10.60)
MCV: 84 fL (ref 79.3–98.0)
MONO#: 0.4 10*3/uL (ref 0.1–0.9)
MONO%: 7.3 % (ref 0.0–14.0)
NEUT#: 2.6 10*3/uL (ref 1.5–6.5)
RDW: 15.3 % — ABNORMAL HIGH (ref 11.0–14.6)
Retic %: 2.33 % — ABNORMAL HIGH (ref 0.80–1.80)
Retic Ct Abs: 106.48 10*3/uL — ABNORMAL HIGH (ref 34.80–93.90)
WBC: 4.9 10*3/uL (ref 4.0–10.3)
lymph#: 1.7 10*3/uL (ref 0.9–3.3)

## 2012-08-30 LAB — IRON AND TIBC
Iron: 77 ug/dL (ref 42–165)
TIBC: 392 ug/dL (ref 215–435)
UIBC: 315 ug/dL (ref 125–400)

## 2012-08-30 MED ORDER — INTEGRA PLUS PO CAPS: 1.0000 | ORAL_CAPSULE | Freq: Every morning | ORAL | Status: DC

## 2012-08-30 NOTE — Patient Instructions (Signed)
Your anemia has significantly improved. Resume treatment with Integra plus. Followup visit in 3 months with repeat iron study.

## 2012-08-30 NOTE — Addendum Note (Signed)
Addended by: Si Gaul on: 08/30/2012 09:23 AM   Modules accepted: Orders

## 2012-08-30 NOTE — Progress Notes (Signed)
Cancer Center [phone]   [phone]  OFFICE PROGRESS NOTE  Provider Not In System No address on file  DIAGNOSIS: Iron Deficiency anemia  PRIOR THERAPY: Status post Feraheme infusion last dose 06/10/2012  CURRENT THERAPY: None. The patient was started treatment with Integra plus 1 capsule by mouth daily.  INTERVAL HISTORY: Caleb Foley 47 y.o. male returns to the clinic today for routine followup visit. The patient is feeling much better today after receiving the Feraheme infusion. He has more energy and activity. He denied having any significant disease presence or fatigue. He denied having any chest pain, shortness breath, cough or hemoptysis. He had repeat CBC and iron study performed earlier today and he is here for evaluation and discussion of his lab results.  MEDICAL HISTORY: Past Medical History  Diagnosis Date  . Umbilical hernia   . Arthritis   . Chronic back pain   . G6PD deficiency anemia     ALLERGIES:  has No Known Allergies.  MEDICATIONS:  Current Outpatient Prescriptions  Medication Sig Dispense Refill  . FeFum-FePoly-FA-B Cmp-C-Biot (INTEGRA PLUS) CAPS Take 1 capsule by mouth every morning.  30 capsule  1  . ibuprofen (ADVIL,MOTRIN) 600 MG tablet Take 600 mg by mouth every 8 (eight) hours as needed. For pain      . HYDROcodone-acetaminophen (VICODIN) 5-500 MG per tablet Take 1 tablet by mouth every 6 (six) hours as needed.      . traMADol (ULTRAM) 50 MG tablet Take 50 mg by mouth every 8 (eight) hours as needed. For pain       No current facility-administered medications for this visit.    SURGICAL HISTORY:  Past Surgical History  Procedure Laterality Date  . Hernia repair  04/30/11    umb hernia  . Lumbar laminectomy/decompression microdiscectomy  06/17/2012    Procedure: LUMBAR LAMINECTOMY/DECOMPRESSION MICRODISCECTOMY 1 LEVEL;  Surgeon: Carmela Hurt, MD;  Location: [facility];  Service: Neurosurgery;   Laterality: Right;  RIGHT Lumbar Four-five diskectomy    REVIEW OF SYSTEMS:  A comprehensive review of systems was negative.   PHYSICAL EXAMINATION: General appearance: alert, cooperative and no distress Head: Normocephalic, without obvious abnormality, atraumatic Neck: no adenopathy Resp: clear to auscultation bilaterally Cardio: regular rate and rhythm, S1, S2 normal, no murmur, click, rub or gallop GI: soft, non-tender; bowel sounds normal; no masses,  no organomegaly Extremities: extremities normal, atraumatic, no cyanosis or edema  ECOG PERFORMANCE STATUS: 0 - Asymptomatic  Blood pressure 141/96, pulse 65, temperature 98.2 F (36.8 C), temperature source Oral, resp. rate 18, height 5\' 5"  (1.651 m), weight 170 lb 11.2 oz (77.429 kg).  LABORATORY DATA: Lab Results  Component Value Date   WBC 4.9 08/30/2012   HGB 12.7* 08/30/2012   HCT 38.4 08/30/2012   MCV 84.0 08/30/2012   PLT 279 08/30/2012      Chemistry      Component Value Date/Time   NA 140 05/16/2012 1020   NA 137 05/09/2012 0829   K 4.5 05/16/2012 1020   K 4.0 05/09/2012 0829   CL 107 05/16/2012 1020   CL 101 05/09/2012 0829   CO2 25 05/16/2012 1020   CO2 26 05/09/2012 0829   BUN 11.0 05/16/2012 1020   BUN 14 05/09/2012 0829   CREATININE 0.9 05/16/2012 1020   CREATININE 0.93 05/09/2012 0829      Component Value Date/Time   CALCIUM 9.4 05/16/2012 1020   CALCIUM 9.7 05/09/2012 0829   ALKPHOS 111  05/16/2012 1020   AST 10 05/16/2012 1020   ALT 16 05/16/2012 1020   BILITOT 0.29 05/16/2012 1020       RADIOGRAPHIC STUDIES: No results found.  ASSESSMENT: This is a very pleasant 47 years old African American male with history of iron deficiency anemia status post Feraheme infusion with significant improvement in his hemoglobin. His iron study and ferritin are still pending.   PLAN: I recommended for the patient to resume his treatment with Integra plus 1 capsule by mouth daily. I would wait for the study and  ferritin to see the patient will need Feraheme infusion again. He would come back for followup visit in 3 months with repeat CBC, iron study and ferritin. He was advised to call immediately if he has any concerning symptoms in the interval.  All questions were answered. The patient knows to call the clinic with any problems, questions or concerns. We can certainly see the patient much sooner if necessary.

## 2012-11-29 ENCOUNTER — Ambulatory Visit: Payer: BC Managed Care – PPO | Admitting: Internal Medicine

## 2012-11-29 ENCOUNTER — Other Ambulatory Visit: Payer: BC Managed Care – PPO

## 2014-05-31 ENCOUNTER — Encounter (HOSPITAL_COMMUNITY): Payer: Self-pay | Admitting: Neurosurgery

## 2015-12-15 ENCOUNTER — Encounter (HOSPITAL_BASED_OUTPATIENT_CLINIC_OR_DEPARTMENT_OTHER): Payer: Self-pay | Admitting: *Deleted

## 2015-12-15 ENCOUNTER — Emergency Department (HOSPITAL_BASED_OUTPATIENT_CLINIC_OR_DEPARTMENT_OTHER): Payer: BLUE CROSS/BLUE SHIELD

## 2015-12-15 ENCOUNTER — Inpatient Hospital Stay (HOSPITAL_BASED_OUTPATIENT_CLINIC_OR_DEPARTMENT_OTHER)
Admission: EM | Admit: 2015-12-15 | Discharge: 2015-12-21 | DRG: 543 | Disposition: A | Discharge status: Home / Self Care | Payer: BLUE CROSS/BLUE SHIELD | Attending: Internal Medicine | Admitting: Internal Medicine

## 2015-12-15 DIAGNOSIS — F17290 Nicotine dependence, other tobacco product, uncomplicated: Secondary | ICD-10-CM | POA: Diagnosis present

## 2015-12-15 DIAGNOSIS — R160 Hepatomegaly, not elsewhere classified: Secondary | ICD-10-CM | POA: Diagnosis present

## 2015-12-15 DIAGNOSIS — G893 Neoplasm related pain (acute) (chronic): Secondary | ICD-10-CM | POA: Diagnosis present

## 2015-12-15 DIAGNOSIS — R1900 Intra-abdominal and pelvic swelling, mass and lump, unspecified site: Secondary | ICD-10-CM | POA: Diagnosis present

## 2015-12-15 DIAGNOSIS — M549 Dorsalgia, unspecified: Secondary | ICD-10-CM | POA: Diagnosis present

## 2015-12-15 DIAGNOSIS — D550 Anemia due to glucose-6-phosphate dehydrogenase [G6PD] deficiency: Secondary | ICD-10-CM | POA: Diagnosis present

## 2015-12-15 DIAGNOSIS — C49A0 Gastrointestinal stromal tumor, unspecified site: Secondary | ICD-10-CM | POA: Diagnosis present

## 2015-12-15 DIAGNOSIS — R1084 Generalized abdominal pain: Secondary | ICD-10-CM

## 2015-12-15 DIAGNOSIS — C49A3 Gastrointestinal stromal tumor of small intestine: Secondary | ICD-10-CM | POA: Diagnosis present

## 2015-12-15 DIAGNOSIS — D649 Anemia, unspecified: Secondary | ICD-10-CM | POA: Diagnosis not present

## 2015-12-15 DIAGNOSIS — C787 Secondary malignant neoplasm of liver and intrahepatic bile duct: Secondary | ICD-10-CM | POA: Diagnosis present

## 2015-12-15 DIAGNOSIS — D500 Iron deficiency anemia secondary to blood loss (chronic): Secondary | ICD-10-CM | POA: Diagnosis not present

## 2015-12-15 DIAGNOSIS — K921 Melena: Secondary | ICD-10-CM | POA: Diagnosis present

## 2015-12-15 DIAGNOSIS — D509 Iron deficiency anemia, unspecified: Secondary | ICD-10-CM | POA: Diagnosis present

## 2015-12-15 LAB — URINALYSIS, ROUTINE W REFLEX MICROSCOPIC
BILIRUBIN URINE: NEGATIVE
Glucose, UA: NEGATIVE mg/dL
HGB URINE DIPSTICK: NEGATIVE
Ketones, ur: NEGATIVE mg/dL
Leukocytes, UA: NEGATIVE
Nitrite: NEGATIVE
PH: 6 (ref 5.0–8.0)
Protein, ur: NEGATIVE mg/dL
SPECIFIC GRAVITY, URINE: 1.018 (ref 1.005–1.030)

## 2015-12-15 LAB — CBC WITH DIFFERENTIAL/PLATELET
Basophils Absolute: 0.1 10*3/uL (ref 0.0–0.1)
Basophils Relative: 1 %
Eosinophils Absolute: 0.1 10*3/uL (ref 0.0–0.7)
Eosinophils Relative: 1 %
HCT: 14.8 % — ABNORMAL LOW (ref 39.0–52.0)
Hemoglobin: 3.3 g/dL — CL (ref 13.0–17.0)
Lymphocytes Relative: 16 %
Lymphs Abs: 0.8 10*3/uL (ref 0.7–4.0)
MCH: 12 pg — ABNORMAL LOW (ref 26.0–34.0)
MCHC: 22.3 g/dL — ABNORMAL LOW (ref 30.0–36.0)
MCV: 53.6 fL — ABNORMAL LOW (ref 78.0–100.0)
Monocytes Absolute: 0.5 10*3/uL (ref 0.1–1.0)
Monocytes Relative: 9 %
Neutro Abs: 3.7 10*3/uL (ref 1.7–7.7)
Neutrophils Relative %: 73 %
Platelets: 431 10*3/uL — ABNORMAL HIGH (ref 150–400)
RBC: 2.76 MIL/uL — ABNORMAL LOW (ref 4.22–5.81)
RDW: 22.9 % — ABNORMAL HIGH (ref 11.5–15.5)
WBC: 5.2 10*3/uL (ref 4.0–10.5)

## 2015-12-15 LAB — COMPREHENSIVE METABOLIC PANEL
ALBUMIN: 3.2 g/dL — AB (ref 3.5–5.0)
ALK PHOS: 98 U/L (ref 38–126)
ALT: 12 U/L — AB (ref 17–63)
ANION GAP: 7 (ref 5–15)
AST: 16 U/L (ref 15–41)
BUN: 12 mg/dL (ref 6–20)
CHLORIDE: 110 mmol/L (ref 101–111)
CO2: 22 mmol/L (ref 22–32)
Calcium: 8.7 mg/dL — ABNORMAL LOW (ref 8.9–10.3)
Creatinine, Ser: 0.91 mg/dL (ref 0.61–1.24)
GFR calc non Af Amer: >60 mL/min (ref >60)
GLUCOSE: 111 mg/dL — AB (ref 65–99)
Potassium: 3.5 mmol/L (ref 3.5–5.1)
SODIUM: 139 mmol/L (ref 135–145)
Total Bilirubin: 0.7 mg/dL (ref 0.3–1.2)
Total Protein: 6.4 g/dL — ABNORMAL LOW (ref 6.5–8.1)

## 2015-12-15 LAB — LIPASE, BLOOD: Lipase: 31 U/L (ref 11–51)

## 2015-12-15 LAB — HEMOGLOBIN AND HEMATOCRIT, BLOOD
HEMATOCRIT: 14.5 % — AB (ref 39.0–52.0)
HEMOGLOBIN: 3.2 g/dL — AB (ref 13.0–17.0)

## 2015-12-15 LAB — PREPARE RBC (CROSSMATCH)

## 2015-12-15 LAB — MRSA PCR SCREENING: MRSA by PCR: NEGATIVE

## 2015-12-15 MED ORDER — SODIUM CHLORIDE 0.9 % IV SOLN
INTRAVENOUS | Status: DC
  Administered 2015-12-15 – 2015-12-19 (×6): via INTRAVENOUS

## 2015-12-15 MED ORDER — ONDANSETRON HCL 4 MG PO TABS: 4.0000 mg | ORAL_TABLET | Freq: Four times a day (QID) | ORAL | Status: DC | PRN

## 2015-12-15 MED ORDER — SODIUM CHLORIDE 0.9 % IV SOLN
Freq: Once | INTRAVENOUS | Status: AC
  Administered 2015-12-15: 23:00:00 via INTRAVENOUS

## 2015-12-15 MED ORDER — ONDANSETRON HCL 4 MG/2ML IJ SOLN: 4.0000 mg | Freq: Four times a day (QID) | INTRAMUSCULAR | Status: DC | PRN

## 2015-12-15 MED ORDER — IOPAMIDOL (ISOVUE-300) INJECTION 61%
100.0000 mL | Freq: Once | INTRAVENOUS | Status: AC | PRN
  Administered 2015-12-15: 100 mL via INTRAVENOUS

## 2015-12-15 NOTE — ED Triage Notes (Signed)
Pt reports abdominal pain with distention x 2-3 weeks.  Denies N/V/D.  Denies fever.  LBM yesterday.  Pts abdomen is notably distended.  Reports umbilical pain radiating to RUQ.

## 2015-12-15 NOTE — ED Notes (Signed)
Patient transported to CT 

## 2015-12-15 NOTE — ED Notes (Signed)
Pt on automatic VS and continuous pulse ox. 

## 2015-12-15 NOTE — H&P (Signed)
History and Physical    Caleb Foley [id number] DOB: 1965/07/26 DOA: 12/15/2015  PCP: PROVIDER NOT IN SYSTEM   Patient coming from: Home.  Chief Complaint: Abdominal pain  HPI: Caleb Foley is a 50 y.o. male with medical history significant of osteoarthritis, chronic back pain, [id number] anemia, umbilical hernia who went to the emergency department at [facility] due to abdominal pain for two days.  Per patient, he has been feeling fatigue for the past 2 months. About 2-3 weeks ago he started noticing that his abdomen was distending and he is started having intermittent melena. He states that his appetite has been decreased, but denies significant weight loss. Since yesterday, the patient developed abdominal pain associated with nausea, but denies emesis, fever or diarrhea. However, he states that his stools have been soft for several weeks.   ED Course: The patient workup revealed anemia with a hemoglobin level at 3.5 g/dL, mild hypoalbuminemia and CT scan of the abdomen/pelvis showing pelvic mass with multiple liver metastasis. Arrangements were made to transfer the patient to [facility].  Review of Systems: As per HPI otherwise 10 point review of systems negative.   Past Medical History:  Diagnosis Date  . Arthritis   . Chronic back pain   . G6PD deficiency anemia ([place])   . Umbilical hernia     Past Surgical History:  Procedure Laterality Date  . HERNIA REPAIR  04/30/11   umb hernia  . LUMBAR LAMINECTOMY/DECOMPRESSION MICRODISCECTOMY  06/17/2012   Procedure: LUMBAR LAMINECTOMY/DECOMPRESSION MICRODISCECTOMY 1 LEVEL;  Surgeon: Winfield Cunas, MD;  Location: [facility];  Service: Neurosurgery;  Laterality: Right;  RIGHT Lumbar Four-five diskectomy     reports that he has been smoking Cigars.  He has a 5.00 pack-year smoking history. He does not have any smokeless tobacco history on file. He reports that he drinks alcohol. He reports that he does not use drugs.  No  Known Allergies  History reviewed. No pertinent family history. Family history reviewed with the patient.  Prior to Admission medications   Medication Sig Start Date End Date Taking? Authorizing Provider  ibuprofen (ADVIL,MOTRIN) 800 MG tablet Take 800 mg by mouth daily as needed (pain).   Yes Historical Provider, MD    Physical Exam: Vitals:   12/15/15 2001 12/15/15 2043 12/15/15 2300 12/15/15 2342  BP: 131/86 128/85 120/79 111/87  Pulse: 90 86 88 91  Resp: 20 (!) 22 (!) 21 (!) 26  Temp: 98.1 F (36.7 C) 98.4 F (36.9 C) 98.5 F (36.9 C) 98.6 F (37 C)  TempSrc: Oral Oral Oral Oral  SpO2: 100% 100% 100% 100%  Weight:  74.9 kg (165 lb 2 oz)    Height:  5\' 5"  (1.651 m)        Constitutional: NAD, calm, comfortable Vitals:   12/15/15 2001 12/15/15 2043 12/15/15 2300 12/15/15 2342  BP: 131/86 128/85 120/79 111/87  Pulse: 90 86 88 91  Resp: 20 (!) 22 (!) 21 (!) 26  Temp: 98.1 F (36.7 C) 98.4 F (36.9 C) 98.5 F (36.9 C) 98.6 F (37 C)  TempSrc: Oral Oral Oral Oral  SpO2: 100% 100% 100% 100%  Weight:  74.9 kg (165 lb 2 oz)    Height:  5\' 5"  (1.651 m)     Eyes: PERRL, lids and conjunctivae are pale ENMT: Mucous membranes are moist. Posterior pharynx clear of any exudate or lesions. Neck: normal, supple, no masses, no thyromegaly Respiratory: clear to auscultation bilaterally, no wheezing, no crackles.Normal respiratory effort.  Cardiovascular: Regular rate and rhythm, no murmurs / rubs / gallops. No extremity edema. 2+ pedal pulses. No carotid bruits.  Abdomen: distended, Bowel sounds positive. mild diffuse tenderness, no guarding/rebound, positive hepatomegaly. Musculoskeletal: no clubbing / cyanosis. No joint deformity upper and lower extremities. Good ROM, no contractures. Normal muscle tone.  Skin: no rashes, lesions, ulcers. No induration Neurologic: CN 2-12 grossly intact. Sensation intact, DTR normal. Strength 5/5 in all 4.  Psychiatric: Normal judgment and  insight. Alert and oriented x 4. Normal mood.    Labs on Admission: I have personally reviewed following labs and imaging studies  CBC:  Recent Labs Lab 12/15/15 1525 12/15/15 1610  WBC 5.2  --   NEUTROABS 3.7  --   HGB 3.3* 3.2*  HCT 14.8* 14.5*  MCV 53.6*  --   PLT 431*  --    Basic Metabolic Panel:  Recent Labs Lab 12/15/15 1525  NA 139  K 3.5  CL 110  CO2 22  GLUCOSE 111*  BUN 12  CREATININE 0.91  CALCIUM 8.7*   GFR: Estimated Creatinine Clearance: 95 mL/min (by C-G formula based on SCr of 0.91 mg/dL). Liver Function Tests:  Recent Labs Lab 12/15/15 1525  AST 16  ALT 12*  ALKPHOS 98  BILITOT 0.7  PROT 6.4*  ALBUMIN 3.2*    Recent Labs Lab 12/15/15 1525  LIPASE 31   Urine analysis:    Component Value Date/Time   COLORURINE YELLOW 12/15/2015 1326   APPEARANCEUR CLEAR 12/15/2015 1326   LABSPEC 1.018 12/15/2015 1326   PHURINE 6.0 12/15/2015 1326   GLUCOSEU NEGATIVE 12/15/2015 1326   HGBUR NEGATIVE 12/15/2015 1326   BILIRUBINUR NEGATIVE 12/15/2015 1326   [place] 12/15/2015 1326   PROTEINUR NEGATIVE 12/15/2015 1326   NITRITE NEGATIVE 12/15/2015 1326   LEUKOCYTESUR NEGATIVE 12/15/2015 1326    Recent Results (from the past 240 hour(s))  MRSA PCR Screening     Status: None   Collection Time: 12/15/15  8:44 PM  Result Value Ref Range Status   MRSA by PCR NEGATIVE NEGATIVE Final    Comment:        The GeneXpert MRSA Assay (FDA approved for NASAL specimens only), is one component of a comprehensive MRSA colonization surveillance program. It is not intended to diagnose MRSA infection nor to guide or monitor treatment for MRSA infections.      Radiological Exams on Admission: Ct Abdomen Pelvis W Contrast  Result Date: 12/15/2015 CLINICAL DATA:  Umbilical pain and swelling for 3 weeks. Post umbilical hernia repair. Decreased hemoglobin. EXAM: CT ABDOMEN AND PELVIS WITH CONTRAST TECHNIQUE: Multidetector CT imaging of the abdomen  and pelvis was performed using the standard protocol following bolus administration of intravenous contrast. CONTRAST:  172mL ISOVUE-300 IOPAMIDOL (ISOVUE-300) INJECTION 61% COMPARISON:  None. FINDINGS: Lower chest:  No acute findings. Hepatobiliary: The liver is markedly enlarged. The liver parenchyma is largely replaced by a large necrotic-appearing masses with the largest mass in the left lobe of the liver measuring 17 by 17 by 14 cm. Blushes of active extravasation are seen within the left hepatic mass. The gallbladder is decompressed. Probable lymphadenopathy in porta hepaticus. Pancreas: No mass, inflammatory changes, or other significant abnormality. Spleen: Within normal limits in size and appearance. Adrenals/Urinary Tract: No masses identified. No evidence of hydronephrosis. Bilateral renal cysts are seen. Stomach/Bowel: No evidence of obstruction. Vascular/Lymphatic: There is a heterogeneously enhancing macro lobulated mass within the pelvis, superior to the urinary bladder, centered in the mesentery, which measures 8.2 x 1.2 by  7.9 cm. Clear fat plane is seen between this mass and the urinary bladder, and the descending colon. No fat plane separates this mass from small bowel loops in the pelvis. No evidence of small-bowel obstruction. Reproductive: The prostate gland is normal. The urinary bladder is decompressed. Other: None. Musculoskeletal:  No suspicious bone lesions identified. IMPRESSION: Large macro lobulated malignant appearing heterogeneous enhancing pelvic mass which seemed to originate within the mesentery. Alternatively it may originate within the small bowel of the pelvis. Given the aggressive appearance of this lesion, primary consideration includes primary mesenteric sarcoma. Lymphoma, carcinoid tumor, desmoid tumor are also in the differential diagnosis. Numerous large necrotic liver masses, which replaces most of the liver parenchyma with evidence of active extravasation within the  largest left hepatic lobe mass. These masses are likely metastatic. Surgical consult is recommended. These results were called by telephone at the time of interpretation on 12/15/2015 at 5:41 pm to Dr. Isla Pence , who verbally acknowledged these results. Electronically Signed   By: Fidela Salisbury M.D.   On: 12/15/2015 17:50   Assessment/Plan Principal Problem:   Severe anemia Likely due to GI loss given melena. The patient has a history of G6PD anemia. Admit to stepdown/inpatient Check anemia profile Transfuse 4 units of packed RBCs. GI to evaluate in the morning.  Active Problems:   Pelvic mass with multiple liver metastases.  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 GI will evaluate the patient in the morning. May likely need surgical or IR evaluation for pathology sample.    Abdominal pain Analgesics as needed.    DVT prophylaxis: SCDs. Code Status: Full code. Family Communication:  Disposition Plan: Admit for blood transfusions and further evaluation. Consults called: GI (Dr. Cristina Gong) Admission status: Inpatient/stepdown   Reubin Milan MD [facility] Pager [phone].  If 7PM-7AM, please contact night-coverage [email or web address] Password [email or web address]  12/15/2015, 11:56 PM

## 2015-12-15 NOTE — Progress Notes (Signed)
Patient ID: Caleb Foley, male   DOB: 1966-04-17, 50 y.o.   MRN: [id number]  Accepted to stepdown bed/inpatient.  Please proceed with sign and held orders for type and screen/crossmatch as soon as the patient arrives to the stepdown unit.  Please call the floor manager at extension [phone] once the patient arrives to the unit for admitting physician assignment.    Per Dr. Gilford Raid.  Chief Complaint  Patient presents with  . Abdominal Pain    HPI Comments:  Caleb Foley is a 50 y.o. male who presents to the Emergency Department complaining of sudden onset, constant, umbilical pain radiating to RUQ abdominal pain with distention onset 2 weeks ago. Pt notes he has taken Ibuprofen with no relief. No alleviating factors noted. Pt is not a smoker but an occasional drinker. No h/o abdominal issues. Pt is in no acute distress. Denies nausea, vomiting, diarrhea, fever.  The history is provided by the patient. No language interpreter was used.        Past Medical History:  Diagnosis Date  . Arthritis   . Chronic back pain   . G6PD deficiency anemia ([place])   . Umbilical hernia         Patient Active Problem List   Diagnosis Date Noted  . Iron deficiency anemia 05/31/2012  . Anemia 05/16/2012   Results- Last 72 Hrs    Component Value Units  Hemoglobin and hematocrit, blood [id number] (Abnormal) Collected: 12/15/15 1610  Updated: 12/15/15 1630   Specimen Type: Blood    Hemoglobin 3.2 (LL) g/dL   HCT 14.5 (L) %  CBC with Differential [id number] (Abnormal) Collected: 12/15/15 1525  Updated: 12/15/15 1606   Specimen Type: Blood    WBC 5.2 K/uL   RBC 2.76 (L) MIL/uL   Hemoglobin 3.3 (LL) g/dL   HCT 14.8 (L) %   MCV 53.6 (L) fL   MCH 12.0 (L) pg   MCHC 22.3 (L) g/dL   RDW 22.9 (H) %   Platelets 431 (H) K/uL   Neutrophils Relative % 73 %   Lymphocytes Relative 16 %   Monocytes Relative 9 %   Eosinophils Relative 1 %   Basophils Relative 1 %   Neutro Abs 3.7  K/uL   Lymphs Abs 0.8 K/uL   Monocytes Absolute 0.5 K/uL   Eosinophils Absolute 0.1 K/uL   Basophils Absolute 0.1 K/uL   RBC Morphology SCHISTOCYTES NOTED ON SMEAR  Comprehensive metabolic panel [id number] (Abnormal) Collected: 12/15/15 1525  Updated: 12/15/15 1601   Specimen Type: Blood    Sodium 139 mmol/L   Potassium 3.5 mmol/L   Chloride 110 mmol/L   CO2 22 mmol/L   Glucose, Bld 111 (H) mg/dL   BUN 12 mg/dL   Creatinine, Ser 0.91 mg/dL   Calcium 8.7 (L) mg/dL   Total Protein 6.4 (L) g/dL   Albumin 3.2 (L) g/dL   AST 16 U/L   ALT 12 (L) U/L   Alkaline Phosphatase 98 U/L   Total Bilirubin 0.7 mg/dL   GFR calc non Af Amer >60 mL/min   GFR calc Af Amer >60 mL/min   Anion gap 7  Lipase, blood [id number] Collected: 12/15/15 1525  Updated: 12/15/15 1601   Specimen Type: Blood    Lipase 31 U/L  Urinalysis, Routine w reflex microscopic (not at [facility]) [id number] Collected: 12/15/15 1326  Updated: 12/15/15 1348   Specimen Type: Urine   Specimen Source: Urine, Random    Color, Urine YELLOW   APPearance CLEAR   Specific Gravity, Urine 1.018  pH 6.0   Glucose, UA NEGATIVE mg/dL   Hgb urine dipstick NEGATIVE   Bilirubin Urine NEGATIVE   Ketones, ur NEGATIVE mg/dL   Protein, ur NEGATIVE mg/dL   Nitrite NEGATIVE   Leukocytes, UA NEGATIVE   IMPRESSION: Large macro lobulated malignant appearing heterogeneous enhancing pelvic mass which seemed to originate within the mesentery. Alternatively it may originate within the small bowel of the pelvis. Given the aggressive appearance of this lesion, primary consideration includes primary mesenteric sarcoma. Lymphoma, carcinoid tumor, desmoid tumor are also in the differential diagnosis. Numerous large necrotic liver masses, which replaces most of the liver parenchyma with evidence of active extravasation within the largest left hepatic lobe mass. These masses are likely metastatic. Surgical consult is recommended. These results  were called by telephone at the time of interpretation on 12/15/2015 at 5:41 pm to Dr. Isla Pence , who verbally acknowledged these results. Electronically Signed   By: Fidela Salisbury M.D.   On: 12/15/2015 17:50  Tennis Must, MD [phone].

## 2015-12-15 NOTE — ED Provider Notes (Signed)
[facility] Provider Note   CSN: [id number] Arrival date & time: 12/15/15  1317  First Provider Contact:  First MD Initiated Contact with Patient 12/15/15 1507   By signing my name below, I, Bridgette Habermann, attest that this documentation has been prepared under the direction and in the presence of Jacalyn Lefevre, MD. Electronically Signed: Bridgette Habermann, ED Scribe. 12/15/15. 3:19 PM.   History   Chief Complaint Chief Complaint  Patient presents with  . Abdominal Pain    HPI Comments: Caleb Foley is a 50 y.o. male who presents to the Emergency Department complaining of sudden onset, constant, umbilical pain radiating to RUQ abdominal pain with distention onset 2 weeks ago. Pt notes he has taken Ibuprofen with no relief. No alleviating factors noted. Pt is not a smoker but an occasional drinker. No h/o abdominal issues. Pt is in no acute distress. Denies nausea, vomiting, diarrhea, fever.  The history is provided by the patient. No language interpreter was used.    Past Medical History:  Diagnosis Date  . Arthritis   . Chronic back pain   . G6PD deficiency anemia ([facility])   . Umbilical hernia     Patient Active Problem List   Diagnosis Date Noted  . Symptomatic anemia 12/15/2015  . Iron deficiency anemia 05/31/2012  . Anemia 05/16/2012    Past Surgical History:  Procedure Laterality Date  . HERNIA REPAIR  04/30/11   umb hernia  . LUMBAR LAMINECTOMY/DECOMPRESSION MICRODISCECTOMY  06/17/2012   Procedure: LUMBAR LAMINECTOMY/DECOMPRESSION MICRODISCECTOMY 1 LEVEL;  Surgeon: Carmela Hurt, MD;  Location: [facility];  Service: Neurosurgery;  Laterality: Right;  RIGHT Lumbar Four-five diskectomy       Home Medications    Prior to Admission medications   Medication Sig Start Date End Date Taking? Authorizing Provider  ibuprofen (ADVIL,MOTRIN) 600 MG tablet Take 600 mg by mouth every 8 (eight) hours as needed. For pain    Historical Provider, MD    Family  History History reviewed. No pertinent family history.  Social History Social History  Substance Use Topics  . Smoking status: Current Every Day Smoker    Packs/day: 1.00    Years: 5.00    Types: Cigars  . Smokeless tobacco: Not on file     Comment: smoking 1-2 black n milds a day  occ alcohol weekends   . Alcohol use Yes     Comment: "social"     Allergies   Review of patient's allergies indicates no known allergies.   Review of Systems Review of Systems  10 Systems reviewed and all are negative for acute change except as noted in the HPI.  Physical Exam Updated Vital Signs BP 121/85 (BP Location: Right Arm)   Pulse 84   Temp 97.9 F (36.6 C)   Resp 18   Ht  (1.651 m)   Wt 165 lb (74.8 kg)   SpO2 100%   BMI 27.46 kg/m   Physical Exam  Constitutional: He appears well-developed and well-nourished.  HENT:  Head: Normocephalic.  Eyes: Conjunctivae are normal.  Cardiovascular: Normal rate.   Pulmonary/Chest: Effort normal. No respiratory distress.  Abdominal: Soft. He exhibits distension. There is tenderness.  RUQ tenderness with a palpable mass. Generalized abd distension.  Musculoskeletal: Normal range of motion.  Neurological: He is alert.  Skin: Skin is warm and dry.  Psychiatric: He has a normal mood and affect. His behavior is normal.  Nursing note and vitals reviewed.    ED Treatments / Results  DIAGNOSTIC STUDIES: Oxygen Saturation is 100% on RA, normal by my interpretation.    COORDINATION OF CARE: 3:11 PM Discussed treatment plan with pt at bedside which includes urinalysis and lab work and pt agreed to plan.  Labs (all labs ordered are listed, but only abnormal results are displayed) Labs Reviewed  CBC WITH DIFFERENTIAL/PLATELET - Abnormal; Notable for the following:       Result Value   RBC 2.76 (*)    Hemoglobin 3.3 (*)    HCT 14.8 (*)    MCV 53.6 (*)    MCH 12.0 (*)    MCHC 22.3 (*)    RDW 22.9 (*)    Platelets 431 (*)    All  other components within normal limits  COMPREHENSIVE METABOLIC PANEL - Abnormal; Notable for the following:    Glucose, Bld 111 (*)    Calcium 8.7 (*)    Total Protein 6.4 (*)    Albumin 3.2 (*)    ALT 12 (*)    All other components within normal limits  HEMOGLOBIN AND HEMATOCRIT, BLOOD - Abnormal; Notable for the following:    Hemoglobin 3.2 (*)    HCT 14.5 (*)    All other components within normal limits  URINALYSIS, ROUTINE W REFLEX MICROSCOPIC (NOT AT [facility])  LIPASE, BLOOD  POC OCCULT BLOOD, ED    EKG  EKG Interpretation None       Radiology Ct Abdomen Pelvis W Contrast  Result Date: 12/15/2015 CLINICAL DATA:  Umbilical pain and swelling for 3 weeks. Post umbilical hernia repair. Decreased hemoglobin. EXAM: CT ABDOMEN AND PELVIS WITH CONTRAST TECHNIQUE: Multidetector CT imaging of the abdomen and pelvis was performed using the standard protocol following bolus administration of intravenous contrast. CONTRAST:  175mL ISOVUE-300 IOPAMIDOL (ISOVUE-300) INJECTION 61% COMPARISON:  None. FINDINGS: Lower chest:  No acute findings. Hepatobiliary: The liver is markedly enlarged. The liver parenchyma is largely replaced by a large necrotic-appearing masses with the largest mass in the left lobe of the liver measuring 17 by 17 by 14 cm. Blushes of active extravasation are seen within the left hepatic mass. The gallbladder is decompressed. Probable lymphadenopathy in porta hepaticus. Pancreas: No mass, inflammatory changes, or other significant abnormality. Spleen: Within normal limits in size and appearance. Adrenals/Urinary Tract: No masses identified. No evidence of hydronephrosis. Bilateral renal cysts are seen. Stomach/Bowel: No evidence of obstruction. Vascular/Lymphatic: There is a heterogeneously enhancing macro lobulated mass within the pelvis, superior to the urinary bladder, centered in the mesentery, which measures 8.2 x 1.2 by 7.9 cm. Clear fat plane is seen between this mass and the  urinary bladder, and the descending colon. No fat plane separates this mass from small bowel loops in the pelvis. No evidence of small-bowel obstruction. Reproductive: The prostate gland is normal. The urinary bladder is decompressed. Other: None. Musculoskeletal:  No suspicious bone lesions identified. IMPRESSION: Large macro lobulated malignant appearing heterogeneous enhancing pelvic mass which seemed to originate within the mesentery. Alternatively it may originate within the small bowel of the pelvis. Given the aggressive appearance of this lesion, primary consideration includes primary mesenteric sarcoma. Lymphoma, carcinoid tumor, desmoid tumor are also in the differential diagnosis. Numerous large necrotic liver masses, which replaces most of the liver parenchyma with evidence of active extravasation within the largest left hepatic lobe mass. These masses are likely metastatic. Surgical consult is recommended. These results were called by telephone at the time of interpretation on 12/15/2015 at 5:41 pm to Dr. Isla Pence , who verbally acknowledged these results.  Electronically Signed   By: Fidela Salisbury M.D.   On: 12/15/2015 17:50   Procedures Procedures (including critical care time)  Medications Ordered in ED Medications  iopamidol (ISOVUE-300) 61 % injection 100 mL (100 mLs Intravenous Contrast Given 12/15/15 1700)     Initial Impression / Assessment and Plan / ED Course  I have reviewed the triage vital signs and the nursing notes.  Pertinent labs & imaging results that were available during my care of the patient were reviewed by me and considered in my medical decision making (see chart for details).  Clinical Course   Pt advised about the results of the CT scan and labs.  Unfortunately, we don't have blood for transfusion unless it is uncrossed emergency release blood.  At this point his bp and hr are normal, so I don't think he needs emergency release.   The pt was d/w Dr.  Olevia Bowens who will accept pt for transfer.  Final Clinical Impressions(s) / ED Diagnoses   Final diagnoses:  Symptomatic anemia  Pelvic mass  Liver metastases ([facility])  Generalized abdominal pain    New Prescriptions New Prescriptions   No medications on file  I personally performed the services described in this documentation, which was scribed in my presence. The recorded information has been reviewed and is accurate.     Isla Pence, MD 12/15/15 [phone]

## 2015-12-15 NOTE — ED Notes (Signed)
[facility] here to pick pt up, pt stable at this time

## 2015-12-16 DIAGNOSIS — R1900 Intra-abdominal and pelvic swelling, mass and lump, unspecified site: Secondary | ICD-10-CM

## 2015-12-16 LAB — IRON AND TIBC
Iron: 6 ug/dL — ABNORMAL LOW (ref 45–182)
SATURATION RATIOS: 1 % — AB (ref 17.9–39.5)
TIBC: 449 ug/dL (ref 250–450)
UIBC: 443 ug/dL

## 2015-12-16 LAB — CBC
HEMATOCRIT: 22.8 % — AB (ref 39.0–52.0)
HEMOGLOBIN: 6.5 g/dL — AB (ref 13.0–17.0)
MCH: 18.4 pg — AB (ref 26.0–34.0)
MCHC: 28.5 g/dL — AB (ref 30.0–36.0)
MCV: 64.6 fL — AB (ref 78.0–100.0)
Platelets: 375 10*3/uL (ref 150–400)
RBC: 3.53 MIL/uL — ABNORMAL LOW (ref 4.22–5.81)
RDW: 32.2 % — ABNORMAL HIGH (ref 11.5–15.5)
WBC: 5.9 10*3/uL (ref 4.0–10.5)

## 2015-12-16 LAB — FERRITIN: Ferritin: 8 ng/mL — ABNORMAL LOW (ref 24–336)

## 2015-12-16 LAB — RETICULOCYTES
RBC.: 2.58 MIL/uL — AB (ref 4.22–5.81)
RETIC COUNT ABSOLUTE: 38.7 10*3/uL (ref 19.0–186.0)
RETIC CT PCT: 1.5 % (ref 0.4–3.1)

## 2015-12-16 LAB — FOLATE: Folate: 5.7 ng/mL — ABNORMAL LOW (ref >5.9)

## 2015-12-16 LAB — VITAMIN B12: VITAMIN B 12: 230 pg/mL (ref 180–914)

## 2015-12-16 LAB — OCCULT BLOOD X 1 CARD TO LAB, STOOL: Fecal Occult Bld: POSITIVE — AB

## 2015-12-16 LAB — PREPARE RBC (CROSSMATCH)

## 2015-12-16 MED ORDER — SODIUM CHLORIDE 0.9 % IV SOLN
Freq: Once | INTRAVENOUS | Status: AC
  Administered 2015-12-16: 18:00:00 via INTRAVENOUS

## 2015-12-16 MED ORDER — MORPHINE SULFATE (PF) 2 MG/ML IV SOLN
2.0000 mg | INTRAVENOUS | Status: DC | PRN
  Administered 2015-12-17: 2 mg via INTRAVENOUS
  Filled 2015-12-16: qty 1

## 2015-12-16 MED ORDER — POLYETHYLENE GLYCOL 3350 17 G PO PACK
17.0000 g | PACK | Freq: Three times a day (TID) | ORAL | Status: DC
Start: 2015-12-16 — End: 2015-12-18
  Administered 2015-12-16 – 2015-12-18 (×3): 17 g via ORAL
  Filled 2015-12-16 (×3): qty 1

## 2015-12-16 MED ORDER — FOLIC ACID 1 MG PO TABS
1.0000 mg | ORAL_TABLET | Freq: Every day | ORAL | Status: DC
  Administered 2015-12-16 – 2015-12-21 (×6): 1 mg via ORAL
  Filled 2015-12-16 (×6): qty 1

## 2015-12-16 MED ORDER — FAMOTIDINE IN NACL 20-0.9 MG/50ML-% IV SOLN
20.0000 mg | Freq: Two times a day (BID) | INTRAVENOUS | Status: DC
  Administered 2015-12-16 – 2015-12-18 (×5): 20 mg via INTRAVENOUS
  Filled 2015-12-16 (×5): qty 50

## 2015-12-16 NOTE — Progress Notes (Signed)
[facility] PROGRESS NOTE  Caleb Foley [id number] DOB: 04/01/1966 DOA: 12/15/2015 PCP: PROVIDER NOT IN SYSTEM  Assessment/Plan: 50 y/o male with PMH of DJD, [id number] anemia, umbilical hernia who presented to [place] due to abdominal pain and black stools for several days. Found to have severe anemia Hg at 3.3 and metastatic lesions in the abdomen   Severe anemia. suspected chronic blood loss anemia. Hg 3.3, ferritin 8, MCV-53 on admission. Also  found to have metastatic lesions -TFusing 3 units. No s/s of acute bleeding. f/u repeat Hg post TF, and cont Tf prn. Check occult blood. Needs endoscopy, awaiting Gi eval   Pelvic mass, metastatic lesion in the abdomen, suspected cancer. CT abd: Large macro lobulated malignant appearing heterogeneous enhancingpelvic mass  Numerous large necrotic liver masses, which replaces most of the liver parenchyma with evidence of active extravasation within the largest left hepatic lobe mass -consulted IR for liver biopsy. Also awaiting GI eval ? Able to perform biopsy while endoscopy. Check AFP, CEA     Code Status: full Family Communication: d/w patient, GI (indicate person spoken with, relationship, and if by phone, the number) Disposition Plan: home pend further work up    Consultants:  GI  Procedures:  May need endoscopy   Antibiotics:  none (indicate start date, and stop date if known)  HPI/Subjective: Alert, no distress, denies bright red blood per rectum. Denies acute chest pain or SOB.   Objective: Vitals:   12/16/15 0633 12/16/15 0700  BP: 127/85 125/84  Pulse: 83 82  Resp: (!) 26 19  Temp: 98.3 F (36.8 C) 98.3 F (36.8 C)    Intake/Output Summary (Last 24 hours) at 12/16/15 0800 Last data filed at 12/16/15 0655  Gross per 24 hour  Intake             1420 ml  Output              600 ml  Net              820 ml   Filed Weights   12/15/15 1323 12/15/15 2043  Weight: 74.8 kg (165 lb) 74.9 kg (165 lb 2 oz)     Exam:   General:  Alert, comfortable   Cardiovascular: [id number] systolic Mr  Respiratory: CTA BL  Abdomen: soft, nt, nd   Musculoskeletal: no leg edema    Data Reviewed: Basic Metabolic Panel:  Recent Labs Lab 12/15/15 1525  NA 139  K 3.5  CL 110  CO2 22  GLUCOSE 111*  BUN 12  CREATININE 0.91  CALCIUM 8.7*   Liver Function Tests:  Recent Labs Lab 12/15/15 1525  AST 16  ALT 12*  ALKPHOS 98  BILITOT 0.7  PROT 6.4*  ALBUMIN 3.2*    Recent Labs Lab 12/15/15 1525  LIPASE 31   No results for input(s): AMMONIA in the last 168 hours. CBC:  Recent Labs Lab 12/15/15 1525 12/15/15 1610  WBC 5.2  --   NEUTROABS 3.7  --   HGB 3.3* 3.2*  HCT 14.8* 14.5*  MCV 53.6*  --   PLT 431*  --    Cardiac Enzymes: No results for input(s): CKTOTAL, CKMB, CKMBINDEX, TROPONINI in the last 168 hours. BNP (last 3 results) No results for input(s): BNP in the last 8760 hours.  ProBNP (last 3 results) No results for input(s): PROBNP in the last 8760 hours.  CBG: No results for input(s): GLUCAP in the last 168 hours.  Recent Results (from the past 240 hour(s))  MRSA PCR Screening     Status: None   Collection Time: 12/15/15  8:44 PM  Result Value Ref Range Status   MRSA by PCR NEGATIVE NEGATIVE Final    Comment:        The GeneXpert MRSA Assay (FDA approved for NASAL specimens only), is one component of a comprehensive MRSA colonization surveillance program. It is not intended to diagnose MRSA infection nor to guide or monitor treatment for MRSA infections.      Studies: Ct Abdomen Pelvis W Contrast  Result Date: 12/15/2015 CLINICAL DATA:  Umbilical pain and swelling for 3 weeks. Post umbilical hernia repair. Decreased hemoglobin. EXAM: CT ABDOMEN AND PELVIS WITH CONTRAST TECHNIQUE: Multidetector CT imaging of the abdomen and pelvis was performed using the standard protocol following bolus administration of intravenous contrast. CONTRAST:  144mL ISOVUE-300  IOPAMIDOL (ISOVUE-300) INJECTION 61% COMPARISON:  None. FINDINGS: Lower chest:  No acute findings. Hepatobiliary: The liver is markedly enlarged. The liver parenchyma is largely replaced by a large necrotic-appearing masses with the largest mass in the left lobe of the liver measuring 17 by 17 by 14 cm. Blushes of active extravasation are seen within the left hepatic mass. The gallbladder is decompressed. Probable lymphadenopathy in porta hepaticus. Pancreas: No mass, inflammatory changes, or other significant abnormality. Spleen: Within normal limits in size and appearance. Adrenals/Urinary Tract: No masses identified. No evidence of hydronephrosis. Bilateral renal cysts are seen. Stomach/Bowel: No evidence of obstruction. Vascular/Lymphatic: There is a heterogeneously enhancing macro lobulated mass within the pelvis, superior to the urinary bladder, centered in the mesentery, which measures 8.2 x 1.2 by 7.9 cm. Clear fat plane is seen between this mass and the urinary bladder, and the descending colon. No fat plane separates this mass from small bowel loops in the pelvis. No evidence of small-bowel obstruction. Reproductive: The prostate gland is normal. The urinary bladder is decompressed. Other: None. Musculoskeletal:  No suspicious bone lesions identified. IMPRESSION: Large macro lobulated malignant appearing heterogeneous enhancing pelvic mass which seemed to originate within the mesentery. Alternatively it may originate within the small bowel of the pelvis. Given the aggressive appearance of this lesion, primary consideration includes primary mesenteric sarcoma. Lymphoma, carcinoid tumor, desmoid tumor are also in the differential diagnosis. Numerous large necrotic liver masses, which replaces most of the liver parenchyma with evidence of active extravasation within the largest left hepatic lobe mass. These masses are likely metastatic. Surgical consult is recommended. These results were called by telephone  at the time of interpretation on 12/15/2015 at 5:41 pm to Dr. Isla Pence , who verbally acknowledged these results. Electronically Signed   By: Fidela Salisbury M.D.   On: 12/15/2015 17:50   Scheduled Meds: . famotidine (PEPCID) IV  20 mg Intravenous Q12H   Continuous Infusions: . sodium chloride 100 mL/hr at 12/16/15 [id number]    Principal Problem:   Severe anemia Active Problems:   Symptomatic anemia   Pelvic mass    Time spent: >35 minutes     Kinnie Feil  [facility] Pager [phone]. If 7PM-7AM, please contact night-coverage at [email or web address], password [facility] 12/16/2015, 8:00 AM  LOS: 1 day

## 2015-12-16 NOTE — Consult Note (Addendum)
[facility] CONS[facility] Reason for consult: possible G.I. bleed, anemia, abdominal mass and liver mass. Referring Physician: [facility]. PCP: none. Primary G.I.: none patient is unassigned  Caleb Foley is an 50 y.o. male.  HPI: he has a history of G6PD that was discovered when he was in the [employer] which is not really caused him a great deal of difficulty. 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 The patient notes for approximately 2 months he has been feeling progressively short of breath and fatigued. For about 2 weeks he has noticed pain in his upper abdomen and has begun to take ibuprofen for this. This is the only medicine that he takes on a regular basis. This is only been for the past several weeks. In spite of the ibuprofen, the pain is gotten worse. His appetite is been poor but he is not really had any nausea or vomiting. He notes that he has had intermittent dark stools that have been darkened tarry but occasionally have been brown. He denies hematochezia. He has never had peptic ulcers and has never had EGD or colonoscopy. No known family history of colon cancer. He presented to the emergency room with the symptoms and was found to have a hemoglobin 3.5. A CT scan of the abdomen was obtained which showed a mass in the pelvis that was felt to be in the mesentery with multiple metastases in liver essentially replacing the normal liver parenchyma. It was not clear if the pelvic mass was related to the G.I. tract or not. The patient apparently presented in an outside hospital and was transferred to [facility] for further treatment.  Past Medical History:  Diagnosis Date  . Arthritis   . Chronic back pain   . G6PD deficiency anemia ([place])   . Umbilical hernia     Past Surgical History:  Procedure Laterality Date  . HERNIA REPAIR  04/30/11   umb hernia  . LUMBAR  LAMINECTOMY/DECOMPRESSION MICRODISCECTOMY  06/17/2012   Procedure: LUMBAR LAMINECTOMY/DECOMPRESSION MICRODISCECTOMY 1 LEVEL;  Surgeon: Winfield Cunas, MD;  Location: [place];  Service: Neurosurgery;  Laterality: Right;  RIGHT Lumbar Four-five diskectomy    History reviewed. No pertinent family history.  Social History:  reports that he has been smoking Cigars.  He has a 5.00 pack-year smoking history. He does not have any smokeless tobacco history on file. He reports that he drinks alcohol. He reports that he does not use drugs.  Allergies: No Known Allergies  Medications; Prior to Admission medications   Medication Sig Start Date End Date Taking? Authorizing Provider  ibuprofen (ADVIL,MOTRIN) 800 MG tablet Take 800 mg by mouth daily as needed (pain).   Yes Historical Provider, MD   . famotidine (PEPCID) IV  20 mg Intravenous Q12H   PRN Meds morphine injection, ondansetron **OR** ondansetron (ZOFRAN) IV Results for orders placed or performed during the hospital encounter of 12/15/15 (from the past 48 hour(s))  Urinalysis, Routine w reflex microscopic (not at [facility])     Status: None   Collection Time: 12/15/15  1:26 PM  Result Value Ref Range   Color, Urine YELLOW YELLOW   APPearance CLEAR CLEAR   Specific Gravity, Urine 1.018 1.005 - 1.030   pH 6.0 5.0 - 8.0   Glucose, UA NEGATIVE NEGATIVE mg/dL   Hgb urine dipstick NEGATIVE NEGATIVE   Bilirubin Urine NEGATIVE NEGATIVE   Ketones, ur NEGATIVE  NEGATIVE mg/dL   Protein, ur NEGATIVE NEGATIVE mg/dL   Nitrite NEGATIVE NEGATIVE   Leukocytes, UA NEGATIVE NEGATIVE    Comment: MICROSCOPIC NOT DONE ON URINES WITH NEGATIVE PROTEIN, BLOOD, LEUKOCYTES, NITRITE, OR GLUCOSE <1000 mg/dL.  CBC with Differential     Status: Abnormal   Collection Time: 12/15/15  3:25 PM  Result Value Ref Range   WBC 5.2 4.0 - 10.5 K/uL   RBC 2.76 (L) 4.22 - 5.81 MIL/uL   Hemoglobin 3.3 (LL) 13.0 - 17.0 g/dL    Comment: RESULT REPEATED AND VERIFIED CRITICAL  RESULT CALLED TO, READ BACK BY AND VERIFIED WITH: Toniann Ket RN @ 1553 ON 12/15/2015 BY Punjtan,G    HCT 14.8 (L) 39.0 - 52.0 %   MCV 53.6 (L) 78.0 - 100.0 fL   MCH 12.0 (L) 26.0 - 34.0 pg   MCHC 22.3 (L) 30.0 - 36.0 g/dL   RDW 22.9 (H) 11.5 - 15.5 %   Platelets 431 (H) 150 - 400 K/uL   Neutrophils Relative % 73 %   Lymphocytes Relative 16 %   Monocytes Relative 9 %   Eosinophils Relative 1 %   Basophils Relative 1 %   Neutro Abs 3.7 1.7 - 7.7 K/uL   Lymphs Abs 0.8 0.7 - 4.0 K/uL   Monocytes Absolute 0.5 0.1 - 1.0 K/uL   Eosinophils Absolute 0.1 0.0 - 0.7 K/uL   Basophils Absolute 0.1 0.0 - 0.1 K/uL   RBC Morphology SCHISTOCYTES NOTED ON SMEAR   Comprehensive metabolic panel     Status: Abnormal   Collection Time: 12/15/15  3:25 PM  Result Value Ref Range   Sodium 139 135 - 145 mmol/L   Potassium 3.5 3.5 - 5.1 mmol/L   Chloride 110 101 - 111 mmol/L   CO2 22 22 - 32 mmol/L   Glucose, Bld 111 (H) 65 - 99 mg/dL   BUN 12 6 - 20 mg/dL   Creatinine, Ser 0.91 0.61 - 1.24 mg/dL   Calcium 8.7 (L) 8.9 - 10.3 mg/dL   Total Protein 6.4 (L) 6.5 - 8.1 g/dL   Albumin 3.2 (L) 3.5 - 5.0 g/dL   AST 16 15 - 41 U/L   ALT 12 (L) 17 - 63 U/L   Alkaline Phosphatase 98 38 - 126 U/L   Total Bilirubin 0.7 0.3 - 1.2 mg/dL   GFR calc non Af Amer >60 >60 mL/min   GFR calc Af Amer >60 >60 mL/min    Comment: (NOTE) The eGFR has been calculated using the CKD EPI equation. This calculation has not been validated in all clinical situations. eGFR's persistently <60 mL/min signify possible Chronic Kidney Disease.    Anion gap 7 5 - 15  Lipase, blood     Status: None   Collection Time: 12/15/15  3:25 PM  Result Value Ref Range   Lipase 31 11 - 51 U/L  Hemoglobin and hematocrit, blood     Status: Abnormal   Collection Time: 12/15/15  4:10 PM  Result Value Ref Range   Hemoglobin 3.2 (LL) 13.0 - 17.0 g/dL    Comment: RESULT REPEATED AND VERIFIED CRITICAL RESULT CALLED TO, READ BACK BY AND VERIFIED  WITH: Mabe,S @ 1630 ON 12/15/2015 BY Punjtan,G    HCT 14.5 (L) 39.0 - 52.0 %  MRSA PCR Screening     Status: None   Collection Time: 12/15/15  8:44 PM  Result Value Ref Range   MRSA by PCR NEGATIVE NEGATIVE    Comment:  The GeneXpert MRSA Assay (FDA approved for NASAL specimens only), is one component of a comprehensive MRSA colonization surveillance program. It is not intended to diagnose MRSA infection nor to guide or monitor treatment for MRSA infections.   Type and screen [place]     Status: None (Preliminary result)   Collection Time: 12/15/15  9:00 PM  Result Value Ref Range   ABO/RH(D) O POS    Antibody Screen NEG    Sample Expiration 12/18/2015    Unit Number [id number]    Blood Component Type RED CELLS,LR    Unit division 00    Status of Unit ISSUED    Transfusion Status OK TO TRANSFUSE    Crossmatch Result Compatible    Unit Number [id number]    Blood Component Type RED CELLS,LR    Unit division 00    Status of Unit ISSUED    Transfusion Status OK TO TRANSFUSE    Crossmatch Result Compatible    Unit Number [id number]    Blood Component Type RED CELLS,LR    Unit division 00    Status of Unit ISSUED    Transfusion Status OK TO TRANSFUSE    Crossmatch Result Compatible    Unit Number [id number]    Blood Component Type RED CELLS,LR    Unit division 00    Status of Unit ALLOCATED    Transfusion Status OK TO TRANSFUSE    Crossmatch Result Compatible   Prepare RBC     Status: None   Collection Time: 12/15/15  9:00 PM  Result Value Ref Range   Order Confirmation ORDER PROCESSED BY BLOOD BANK   Vitamin B12     Status: None   Collection Time: 12/15/15 11:20 PM  Result Value Ref Range   Vitamin B-12 230 180 - 914 pg/mL    Comment: (NOTE) This assay is not validated for testing neonatal or myeloproliferative syndrome specimens for Vitamin B12 levels.   Folate     Status: Abnormal   Collection Time: 12/15/15 11:20 PM   Result Value Ref Range   Folate 5.7 (L) >5.9 ng/mL  Iron and TIBC     Status: Abnormal   Collection Time: 12/15/15 11:20 PM  Result Value Ref Range   Iron 6 (L) 45 - 182 ug/dL   TIBC 449 250 - 450 ug/dL   Saturation Ratios 1 (L) 17.9 - 39.5 %   UIBC 443 ug/dL  Ferritin     Status: Abnormal   Collection Time: 12/15/15 11:20 PM  Result Value Ref Range   Ferritin 8 (L) 24 - 336 ng/mL  Reticulocytes     Status: Abnormal   Collection Time: 12/15/15 11:20 PM  Result Value Ref Range   Retic Ct Pct 1.5 0.4 - 3.1 %   RBC. 2.58 (L) 4.22 - 5.81 MIL/uL   Retic Count, Manual 38.7 19.0 - 186.0 K/uL    Ct Abdomen Pelvis W Contrast  Result Date: 12/15/2015 CLINICAL DATA:  Umbilical pain and swelling for 3 weeks. Post umbilical hernia repair. Decreased hemoglobin. EXAM: CT ABDOMEN AND PELVIS WITH CONTRAST TECHNIQUE: Multidetector CT imaging of the abdomen and pelvis was performed using the standard protocol following bolus administration of intravenous contrast. CONTRAST:  172m ISOVUE-300 IOPAMIDOL (ISOVUE-300) INJECTION 61% COMPARISON:  None. FINDINGS: Lower chest:  No acute findings. Hepatobiliary: The liver is markedly enlarged. The liver parenchyma is largely replaced by a large necrotic-appearing masses with the largest mass in the left lobe of the liver measuring 17 by 17 by  14 cm. Blushes of active extravasation are seen within the left hepatic mass. The gallbladder is decompressed. Probable lymphadenopathy in porta hepaticus. Pancreas: No mass, inflammatory changes, or other significant abnormality. Spleen: Within normal limits in size and appearance. Adrenals/Urinary Tract: No masses identified. No evidence of hydronephrosis. Bilateral renal cysts are seen. Stomach/Bowel: No evidence of obstruction. Vascular/Lymphatic: There is a heterogeneously enhancing macro lobulated mass within the pelvis, superior to the urinary bladder, centered in the mesentery, which measures 8.2 x 1.2 by 7.9 cm. Clear fat  plane is seen between this mass and the urinary bladder, and the descending colon. No fat plane separates this mass from small bowel loops in the pelvis. No evidence of small-bowel obstruction. Reproductive: The prostate gland is normal. The urinary bladder is decompressed. Other: None. Musculoskeletal:  No suspicious bone lesions identified. IMPRESSION: Large macro lobulated malignant appearing heterogeneous enhancing pelvic mass which seemed to originate within the mesentery. Alternatively it may originate within the small bowel of the pelvis. Given the aggressive appearance of this lesion, primary consideration includes primary mesenteric sarcoma. Lymphoma, carcinoid tumor, desmoid tumor are also in the differential diagnosis. Numerous large necrotic liver masses, which replaces most of the liver parenchyma with evidence of active extravasation within the largest left hepatic lobe mass. These masses are likely metastatic. Surgical consult is recommended. These results were called by telephone at the time of interpretation on 12/15/2015 at 5:41 pm to Dr. Isla Pence , who verbally acknowledged these results. Electronically Signed   By: Fidela Salisbury M.D.   On: 12/15/2015 17:50              Blood pressure 125/84, pulse 82, temperature 98.3 F (36.8 C), temperature source Oral, resp. rate 19, height 5' 5"  (1.651 m), weight 74.9 kg (165 lb 2 oz), SpO2 100 %.  Physical exam:   General-- pleasant African-American male no distress ENT-- nonicteric Neck-- supple with no lymphadenopathy Heart-- regular rate and rhythm without murmurs are gallops Lungs-- clear Abdomen-- bowel sounds present. The liver is massively enlarged with a mass effect extending from the right sub costal margin down to the umbilicus that is somewhat tender. Psych-- alert and oriented   Assessment: 1. Pelvic mass/hepatic mass. Probably represent some type of tumor. Not clear that this is primarily G.I. The easiest  way to get a handle on this may be to obtain a percutaneous liver biopsy. 2. Severe anemia. We do not yet have stool for FOB but the patient does give a history of intermittent melena. He's been taking ibuprofen and could be bleeding from an ulcer. We may need to consider endoscopic evaluation once he has been transfused in stool for FOB has returned.  Plan: 1. Would go ahead and complete transfusion is planned. We'll wait for the stool for FOB. 2. We give them. PPI for now and hold NSAIDs. 3. We will give him clear liquids and Miralax for now. 4. Would obtain opinion from a IR about possible liver biopsy    Apphia Cropley JR,Chaney Ingram L 12/16/2015, 8:47 AM   This note was created using voice recognition software and minor errors may Have occurred unintentionally. Pager: [phone] If no answer or after hours call [phone]

## 2015-12-16 NOTE — Consult Note (Signed)
Chief Complaint: Patient was seen in consultation today for liver lesion biopsy Chief Complaint  Patient presents with  . Abdominal Pain   at the request of Dr Laurence Spates  Referring Physician(s): Dr Laurence Spates  Supervising Physician: Arne Cleveland  Patient Status: Inpatient  History of Present Illness: Caleb Foley is a 50 y.o. male   Hx G6PD- discovered while in [place] Father with G6PD also No Ca hx  Has noted back pain for few months abd pain worsening x 2 weeks  CT 7/30: IMPRESSION: Large macro lobulated malignant appearing heterogeneous enhancing pelvic mass which seemed to originate within the mesentery. Alternatively it may originate within the small bowel of the pelvis. Given the aggressive appearance of this lesion, primary consideration includes primary mesenteric sarcoma. Lymphoma, carcinoid tumor, desmoid tumor are also in the differential diagnosis. Numerous large necrotic liver masses, which replaces most of the liver parenchyma with evidence of active extravasation within the largest left hepatic lobe mass. These masses are likely metastatic. Surgical consult is recommended.  Request for liver lesion biopsy Dr Vernard Gambles has reviewed imaging and approves procedure  H./H: 3.2/14.5 plt 431 PT/PTT pending   Past Medical History:  Diagnosis Date  . Arthritis   . Chronic back pain   . 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 Umbilical hernia     Past Surgical History:  Procedure Laterality Date  . HERNIA REPAIR  04/30/11   umb hernia  . LUMBAR LAMINECTOMY/DECOMPRESSION MICRODISCECTOMY  06/17/2012   Procedure: LUMBAR LAMINECTOMY/DECOMPRESSION MICRODISCECTOMY 1 LEVEL;  Surgeon: Winfield Cunas, MD;  Location: [facility];  Service: Neurosurgery;  Laterality: Right;  RIGHT Lumbar Four-five diskectomy    Allergies: Review of patient's allergies indicates no known allergies.  Medications: Prior to Admission medications   Medication Sig  Start Date End Date Taking? Authorizing Provider  ibuprofen (ADVIL,MOTRIN) 800 MG tablet Take 800 mg by mouth daily as needed (pain).   Yes Historical Provider, MD     History reviewed. No pertinent family history.  Social History   Social History  . Marital status: Single    Spouse name: N/A  . Number of children: N/A  . Years of education: N/A   Social History Main Topics  . Smoking status: Current Every Day Smoker    Packs/day: 1.00    Years: 5.00    Types: Cigars  . Smokeless tobacco: None     Comment: smoking 1-2 black n milds a day  occ alcohol weekends   . Alcohol use Yes     Comment: "social"  . Drug use: No  . Sexual activity: Not Asked   Other Topics Concern  . None   Social History Narrative  . None     Review of Systems: A 12 point ROS discussed and pertinent positives are indicated in the HPI above.  All other systems are negative.  Review of Systems  Constitutional: Positive for activity change and appetite change. Negative for fatigue and fever.  Respiratory: Negative for shortness of breath.   Cardiovascular: Negative for chest pain.  Gastrointestinal: Positive for abdominal distention and abdominal pain.  Musculoskeletal: Positive for back pain.  Psychiatric/Behavioral: Negative for behavioral problems and confusion.    Vital Signs: BP (!) 129/91 (BP Location: Right Arm)   Pulse 85   Temp 98.4 F (36.9 C) (Oral)   Resp (!) 22   Ht 5\' 5"  (1.651 m)   Wt 165 lb 2 oz (74.9 kg)   SpO2 100%   BMI 27.48  kg/m   Physical Exam  Constitutional: He is oriented to person, place, and time. He appears well-nourished.  Cardiovascular: Normal rate, regular rhythm and normal heart sounds.   Pulmonary/Chest: Effort normal and breath sounds normal.  Abdominal: Bowel sounds are normal. He exhibits distension. There is tenderness.  Musculoskeletal: Normal range of motion.  Neurological: He is alert and oriented to person, place, and time.  Skin: Skin is warm  and dry.  Psychiatric: He has a normal mood and affect. His behavior is normal. Judgment and thought content normal.  Nursing note and vitals reviewed.   Mallampati Score:  MD Evaluation Airway: WNL Heart: WNL Abdomen: WNL Chest/ Lungs: WNL ASA  Classification: 3 Mallampati/Airway Score: One  Imaging: Ct Abdomen Pelvis W Contrast  Result Date: 12/15/2015 CLINICAL DATA:  Umbilical pain and swelling for 3 weeks. Post umbilical hernia repair. Decreased hemoglobin. EXAM: CT ABDOMEN AND PELVIS WITH CONTRAST TECHNIQUE: Multidetector CT imaging of the abdomen and pelvis was performed using the standard protocol following bolus administration of intravenous contrast. CONTRAST:  155mL ISOVUE-300 IOPAMIDOL (ISOVUE-300) INJECTION 61% COMPARISON:  None. FINDINGS: Lower chest:  No acute findings. Hepatobiliary: The liver is markedly enlarged. The liver parenchyma is largely replaced by a large necrotic-appearing masses with the largest mass in the left lobe of the liver measuring 17 by 17 by 14 cm. Blushes of active extravasation are seen within the left hepatic mass. The gallbladder is decompressed. Probable lymphadenopathy in porta hepaticus. Pancreas: No mass, inflammatory changes, or other significant abnormality. Spleen: Within normal limits in size and appearance. Adrenals/Urinary Tract: No masses identified. No evidence of hydronephrosis. Bilateral renal cysts are seen. Stomach/Bowel: No evidence of obstruction. Vascular/Lymphatic: There is a heterogeneously enhancing macro lobulated mass within the pelvis, superior to the urinary bladder, centered in the mesentery, which measures 8.2 x 1.2 by 7.9 cm. Clear fat plane is seen between this mass and the urinary bladder, and the descending colon. No fat plane separates this mass from small bowel loops in the pelvis. No evidence of small-bowel obstruction. Reproductive: The prostate gland is normal. The urinary bladder is decompressed. Other: None.  Musculoskeletal:  No suspicious bone lesions identified. IMPRESSION: Large macro lobulated malignant appearing heterogeneous enhancing pelvic mass which seemed to originate within the mesentery. Alternatively it may originate within the small bowel of the pelvis. Given the aggressive appearance of this lesion, primary consideration includes primary mesenteric sarcoma. Lymphoma, carcinoid tumor, desmoid tumor are also in the differential diagnosis. Numerous large necrotic liver masses, which replaces most of the liver parenchyma with evidence of active extravasation within the largest left hepatic lobe mass. These masses are likely metastatic. Surgical consult is recommended. These results were called by telephone at the time of interpretation on 12/15/2015 at 5:41 pm to Dr. Isla Pence , who verbally acknowledged these results. Electronically Signed   By: Fidela Salisbury M.D.   On: 12/15/2015 17:50   Labs:  CBC:  Recent Labs  12/15/15 1525 12/15/15 1610  WBC 5.2  --   HGB 3.3* 3.2*  HCT 14.8* 14.5*  PLT 431*  --     COAGS: No results for input(s): INR, APTT in the last 8760 hours.  BMP:  Recent Labs  12/15/15 1525  NA 139  K 3.5  CL 110  CO2 22  GLUCOSE 111*  BUN 12  CALCIUM 8.7*  CREATININE 0.91  GFRNONAA >60  GFRAA >60    LIVER FUNCTION TESTS:  Recent Labs  12/15/15 1525  BILITOT 0.7  AST 16  ALT 12*  ALKPHOS 98  PROT 6.4*  ALBUMIN 3.2*    TUMOR MARKERS: No results for input(s): AFPTM, CEA, CA199, CHROMGRNA in the last 8760 hours.  Assessment and Plan:  abd pain; distension Tight feeling Back pain Noted on work up is liver lesions and pelvic mass Now scheduled for liver lesion biopsy Risks and Benefits discussed with the patient including, but not limited to bleeding, infection, damage to adjacent structures or low yield requiring additional tests. All of the patient's questions were answered, patient is agreeable to proceed. Consent signed and in  chart.   Thank you for this interesting consult.  I greatly enjoyed meeting Caleb Foley and look forward to participating in their care.  A copy of this report was sent to the requesting provider on this date.  Electronically Signed: Monia Sabal A 12/16/2015, 2:17 PM   I spent a total of 40 Minutes    in face to face in clinical consultation, greater than 50% of which was counseling/coordinating care for liver lesion biopsy

## 2015-12-16 NOTE — Plan of Care (Signed)
Problem: Safety: Goal: Ability to remain free from injury will improve Outcome: Progressing Pt understands to call for help when getting out of bed. No falls or injuries this shift. Patient educated about fall prevention. Pt verbalized understanding.    

## 2015-12-16 NOTE — Progress Notes (Signed)
CRITICAL VALUE ALERT  Critical value received:  Hemoglobin 6.5  Date of notification:  12/16/15  Time of notification:  1615 Critical value read back:Yes.    Nurse who received alert:  Constance Goltz  MD notified (1st page):  Dr. Daleen Bo  Time of first page:  10  MD notified (2nd page):  Time of second page:  Responding MD: Dr. Daleen Bo  Time MD responded:  Ordered 1 unit of PRBC

## 2015-12-17 ENCOUNTER — Inpatient Hospital Stay (HOSPITAL_COMMUNITY): Payer: BLUE CROSS/BLUE SHIELD

## 2015-12-17 DIAGNOSIS — D649 Anemia, unspecified: Secondary | ICD-10-CM

## 2015-12-17 LAB — CBC
HEMATOCRIT: 25.5 % — AB (ref 39.0–52.0)
HEMATOCRIT: 21.8 % — AB (ref 39.0–52.0)
HEMOGLOBIN: 7.3 g/dL — AB (ref 13.0–17.0)
HEMOGLOBIN: 6.2 g/dL — AB (ref 13.0–17.0)
MCH: 18.8 pg — AB (ref 26.0–34.0)
MCH: 18.7 pg — ABNORMAL LOW (ref 26.0–34.0)
MCHC: 28.6 g/dL — AB (ref 30.0–36.0)
MCHC: 28.4 g/dL — ABNORMAL LOW (ref 30.0–36.0)
MCV: 65.6 fL — ABNORMAL LOW (ref 78.0–100.0)
MCV: 65.9 fL — ABNORMAL LOW (ref 78.0–100.0)
Platelets: 335 10*3/uL (ref 150–400)
Platelets: 261 10*3/uL (ref 150–400)
RBC: 3.89 MIL/uL — ABNORMAL LOW (ref 4.22–5.81)
RBC: 3.31 MIL/uL — ABNORMAL LOW (ref 4.22–5.81)
RDW: 31.7 % — ABNORMAL HIGH (ref 11.5–15.5)
RDW: 32.3 % — AB (ref 11.5–15.5)
WBC: 6.7 10*3/uL (ref 4.0–10.5)
WBC: 7.9 10*3/uL (ref 4.0–10.5)

## 2015-12-17 LAB — PROTIME-INR
INR: 1.08
Prothrombin Time: 14.1 seconds (ref 11.4–15.2)

## 2015-12-17 LAB — APTT: aPTT: 31 seconds (ref 24–36)

## 2015-12-17 LAB — HEMOGLOBIN AND HEMATOCRIT, BLOOD
HEMATOCRIT: 25.6 % — AB (ref 39.0–52.0)
Hemoglobin: 7.5 g/dL — ABNORMAL LOW (ref 13.0–17.0)

## 2015-12-17 LAB — PREPARE RBC (CROSSMATCH)

## 2015-12-17 MED ORDER — OXYCODONE HCL 5 MG PO TABS: 5.0000 mg | ORAL_TABLET | ORAL | Status: DC | PRN

## 2015-12-17 MED ORDER — MIDAZOLAM HCL 2 MG/2ML IJ SOLN
INTRAMUSCULAR | Status: AC
  Filled 2015-12-17: qty 2

## 2015-12-17 MED ORDER — FERUMOXYTOL INJECTION 510 MG/17 ML
510.0000 mg | Freq: Once | INTRAVENOUS | Status: AC
  Administered 2015-12-17: 510 mg via INTRAVENOUS
  Filled 2015-12-17: qty 17

## 2015-12-17 MED ORDER — SODIUM CHLORIDE 0.9 % IV SOLN: Freq: Once | INTRAVENOUS | Status: DC

## 2015-12-17 MED ORDER — FENTANYL CITRATE (PF) 100 MCG/2ML IJ SOLN
INTRAMUSCULAR | Status: AC
  Filled 2015-12-17: qty 2

## 2015-12-17 MED ORDER — MORPHINE SULFATE (PF) 2 MG/ML IV SOLN: 2.0000 mg | INTRAVENOUS | Status: DC | PRN

## 2015-12-17 MED ORDER — GELATIN ABSORBABLE 12-7 MM EX MISC
CUTANEOUS | Status: AC
  Filled 2015-12-17: qty 1

## 2015-12-17 MED ORDER — MIDAZOLAM HCL 2 MG/2ML IJ SOLN
INTRAMUSCULAR | Status: AC | PRN
  Administered 2015-12-17: 0.5 mg via INTRAVENOUS
  Administered 2015-12-17: 1 mg via INTRAVENOUS

## 2015-12-17 MED ORDER — CYANOCOBALAMIN 1000 MCG/ML IJ SOLN
1000.0000 ug | Freq: Every day | INTRAMUSCULAR | Status: AC
  Administered 2015-12-17 – 2015-12-19 (×3): 1000 ug via SUBCUTANEOUS
  Filled 2015-12-17 (×3): qty 1

## 2015-12-17 MED ORDER — FENTANYL CITRATE (PF) 100 MCG/2ML IJ SOLN
INTRAMUSCULAR | Status: AC | PRN
  Administered 2015-12-17: 25 ug via INTRAVENOUS
  Administered 2015-12-17: 50 ug via INTRAVENOUS

## 2015-12-17 MED ORDER — LIDOCAINE HCL 1 % IJ SOLN
INTRAMUSCULAR | Status: AC
  Filled 2015-12-17: qty 20

## 2015-12-17 NOTE — Procedures (Signed)
Interventional Radiology Procedure Note  Procedure: US guided liver mass biopsy.  4 x 18G core.   Complications: None Recommendations:  - Ok to shower tomorrow - Do not submerge for 7 days - Routine wound care - follow up pathology   Signed,  Dulcy Fanny. Earleen Newport, DO

## 2015-12-17 NOTE — Progress Notes (Signed)
CRITICAL VALUE ALERT  Critical value received:  Hemoglobin 6.2  Date of notification:  12/17/15  Time of notification:  06:29pm  Critical value read back:Yes.    Nurse who received alert:  Constance Goltz  MD notified (1st page):  Dr. Thereasa Solo  Time of first page:  06:29pm  MD notified (2nd page):  Time of second page:  Responding MD:    Time MD responded:

## 2015-12-17 NOTE — Progress Notes (Signed)
Kyl Kievit 12:01 PM  Subjective: Patient without any problems from his liver biopsy and no new complaints and his case discussed with my partner Dr. 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

## 2015-12-17 NOTE — Progress Notes (Signed)
[facility] TEAM 1 - Stepdown/ICU TEAM  Dewain Penning  FM:[id number] DOB: Nov 27, 1965 DOA: 12/15/2015 PCP: PROVIDER NOT IN SYSTEM    Brief Narrative:  50 y.o. male with history of osteoarthritis, chronic back pain, G6PD anemia, and an umbilical hernia who went to the ED at [facility] due to abdominal pain for two days.  He had been feeling fatigued for 2 months. 2-3 weeks prior to this admit he started noticing abdominal distention and intermittent melena.   In the ED the pt has found to have a hemoglobin of 3.5 g/dL and mild hypoalbuminemia.  CT of the abdomen/pelvis noted a pelvic mass with multiple liver metastasis. Arrangements were made to transfer the patient to [facility].  Subjective: The pt c/o worsening abdom pain this morning.  Overall he states he feels much better.  He denies cp, n/v, HA, or diarrhea.  He was working just last week.   Assessment & Plan:  Severe anemia - suspected subacute blood loss on chronic G6PD anemia  Ferritin is 8, and Fe level is 6 - folate is marginal, and B12 is low normal - cont to transfuse prn to keep Hgb 7.0 or > - IV Fe load today - replace B12 and folate   Recent Labs Lab 12/15/15 1525 12/15/15 1610 12/16/15 1411 12/16/15 2346 12/17/15 0229  HGB 3.3* 3.2* 6.5* 7.5* 7.3*    GIB - melena  FOB is + - GI to perform EGD   Pelvic mass w/ multiple liver masses  To have bx of liver lesion per IR to make diagnosis   DVT prophylaxis: SCDs Code Status: FULL CODE Family Communication: no family present at time of exam  Disposition Plan: SDU  Consultants:  [facility]  Procedures: none  Antimicrobials:  none   Objective: Blood pressure 130/86, pulse 79, temperature 97.6 F (36.4 C), temperature source Oral, resp. rate 20, height 5\' 5"  (1.651 m), weight 74.9 kg (165 lb 2 oz), SpO2 99 %.  Intake/Output Summary (Last 24 hours) at 12/17/15 0825 Last data filed at 12/17/15 0600  Gross per 24 hour  Intake             3466 ml    Output             1350 ml  Net             2116 ml   Filed Weights   12/15/15 1323 12/15/15 2043  Weight: 74.8 kg (165 lb) 74.9 kg (165 lb 2 oz)    Examination: General: No acute respiratory distress Lungs: Clear to auscultation bilaterally without wheezes or crackles Cardiovascular: Regular rate and rhythm without murmur gallop or rub normal S1 and S2 Abdomen: distended, tender diffusely, enlarged lobular liver palpable in RUQ, no rebound Extremities: No significant cyanosis, clubbing, or edema bilateral lower extremities  CBC:  Recent Labs Lab 12/15/15 1525 12/15/15 1610 12/16/15 1411 12/16/15 2346 12/17/15 0229  WBC 5.2  --  5.9  --  6.7  NEUTROABS 3.7  --   --   --   --   HGB 3.3* 3.2* 6.5* 7.5* 7.3*  HCT 14.8* 14.5* 22.8* 25.6* 25.5*  MCV 53.6*  --  64.6*  --  65.6*  PLT 431*  --  375  --  123456   Basic Metabolic Panel:  Recent Labs Lab 12/15/15 1525  NA 139  K 3.5  CL 110  CO2 22  GLUCOSE 111*  BUN 12  CREATININE 0.91  CALCIUM 8.7*   GFR:  Estimated Creatinine Clearance: 95 mL/min (by C-G formula based on SCr of 0.91 mg/dL).  Liver Function Tests:  Recent Labs Lab 12/15/15 1525  AST 16  ALT 12*  ALKPHOS 98  BILITOT 0.7  PROT 6.4*  ALBUMIN 3.2*    Recent Labs Lab 12/15/15 1525  LIPASE 31   Coagulation Profile:  Recent Labs Lab 12/17/15 0229  INR 1.08    HbA1C: Hemoglobin A1C  Date/Time Value Ref Range Status  04/20/2012 01:55 PM 5.0  Final     Recent Results (from the past 240 hour(s))  MRSA PCR Screening     Status: None   Collection Time: 12/15/15  8:44 PM  Result Value Ref Range Status   MRSA by PCR NEGATIVE NEGATIVE Final    Comment:        The GeneXpert MRSA Assay (FDA approved for NASAL specimens only), is one component of a comprehensive MRSA colonization surveillance program. It is not intended to diagnose MRSA infection nor to guide or monitor treatment for MRSA infections.      Scheduled Meds: .  famotidine (PEPCID) IV  20 mg Intravenous Q12H  . folic acid  1 mg Oral Daily  . polyethylene glycol  17 g Oral TID     LOS: 2 days    Cherene Altes, MD [facility] Office  [phone] Pager - Text Page per Amion as per below:  On-Call/Text Page:      [email or web address]      password [email or web address]  If 7PM-7AM, please contact night-coverage [email or web address] Password [email or web address] 12/17/2015, 8:25 AM

## 2015-12-18 LAB — CBC
HCT: 30.5 % — ABNORMAL LOW (ref 39.0–52.0)
HEMATOCRIT: 28.5 % — AB (ref 39.0–52.0)
HEMOGLOBIN: 8.5 g/dL — AB (ref 13.0–17.0)
HEMOGLOBIN: 9 g/dL — AB (ref 13.0–17.0)
MCH: 20.9 pg — AB (ref 26.0–34.0)
MCH: 20.9 pg — AB (ref 26.0–34.0)
MCHC: 29.8 g/dL — AB (ref 30.0–36.0)
MCHC: 29.5 g/dL — AB (ref 30.0–36.0)
MCV: 70.2 fL — ABNORMAL LOW (ref 78.0–100.0)
MCV: 70.9 fL — ABNORMAL LOW (ref 78.0–100.0)
PLATELETS: 272 10*3/uL (ref 150–400)
Platelets: 257 10*3/uL (ref 150–400)
RBC: 4.06 MIL/uL — AB (ref 4.22–5.81)
RBC: 4.3 MIL/uL (ref 4.22–5.81)
WBC: 8 10*3/uL (ref 4.0–10.5)
WBC: 8.7 10*3/uL (ref 4.0–10.5)

## 2015-12-18 LAB — COMPREHENSIVE METABOLIC PANEL
ALK PHOS: 113 U/L (ref 38–126)
ALT: 11 U/L — AB (ref 17–63)
ANION GAP: 7 (ref 5–15)
AST: 14 U/L — ABNORMAL LOW (ref 15–41)
Albumin: 2.9 g/dL — ABNORMAL LOW (ref 3.5–5.0)
BILIRUBIN TOTAL: 2.5 mg/dL — AB (ref 0.3–1.2)
BUN: <5 mg/dL — ABNORMAL LOW (ref 6–20)
CALCIUM: 8.9 mg/dL (ref 8.9–10.3)
CO2: 23 mmol/L (ref 22–32)
CREATININE: 0.88 mg/dL (ref 0.61–1.24)
Chloride: 108 mmol/L (ref 101–111)
GFR calc non Af Amer: >60 mL/min (ref >60)
GLUCOSE: 89 mg/dL (ref 65–99)
Potassium: 3.7 mmol/L (ref 3.5–5.1)
Sodium: 138 mmol/L (ref 135–145)
TOTAL PROTEIN: 5.9 g/dL — AB (ref 6.5–8.1)

## 2015-12-18 MED ORDER — SODIUM CHLORIDE 0.9% FLUSH
10.0000 mL | INTRAVENOUS | Status: DC | PRN
  Administered 2015-12-21: 10 mL
  Filled 2015-12-18: qty 40

## 2015-12-18 MED ORDER — PANTOPRAZOLE SODIUM 40 MG PO TBEC
40.0000 mg | DELAYED_RELEASE_TABLET | Freq: Every day | ORAL | Status: DC
  Administered 2015-12-19 – 2015-12-21 (×3): 40 mg via ORAL
  Filled 2015-12-18 (×3): qty 1

## 2015-12-18 MED ORDER — POLYETHYLENE GLYCOL 3350 17 G PO PACK
17.0000 g | PACK | Freq: Two times a day (BID) | ORAL | Status: DC
  Administered 2015-12-19 – 2015-12-20 (×2): 17 g via ORAL
  Filled 2015-12-18 (×5): qty 1

## 2015-12-18 NOTE — Progress Notes (Signed)
[facility] TEAM 1 - Stepdown/ICU TEAM  Caleb Foley  FM:[id number] DOB: [birth year] DOA: 12/15/2015 PCP: PROVIDER NOT IN SYSTEM    Brief Narrative:  50 y.o. male with history of osteoarthritis, chronic back pain, G6PD anemia, and an umbilical hernia who went to the ED at [facility] due to abdominal pain for two days.  He had been feeling fatigued for 2 months. 2-3 weeks prior to this admit he started noticing abdominal distention and intermittent melena.   In the ED the pt has found to have a hemoglobin of 3.5 g/dL and mild hypoalbuminemia.  CT of the abdomen/pelvis noted a pelvic mass with multiple liver metastasis. Arrangements were made to transfer the patient to [facility].  Subjective: The patient is resting comfortably in bed.  He reports some tenderness in the right upper quadrant near the site of his liver biopsy.  He reports his abdomen felt very tense last night but it seems less tense this morning.  He denies nausea vomiting chest pain shortness of breath fevers or chills.  He is tolerating his diet currently without any difficulty.  Assessment & Plan:  Severe anemia - suspected subacute blood loss on chronic G6PD anemia  Ferritin 8 - Fe level 6 - folate is marginal, and B12 low normal - transfuse prn to keep Hgb 7.0 or > - IV Fe loaded - replace B12 and folate - no evidence of ongoing bleeding at this time   Recent Labs Lab 12/16/15 1411 12/16/15 2346 12/17/15 0229 12/17/15 1739 12/18/15 0656  HGB 6.5* 7.5* 7.3* 6.2* 8.5*    GIB - melena  FOB is + - GI has evaluated but does not currently feel that endoscopy is indicated given the overall situation   Pelvic mass w/ multiple liver masses  S/p bx of liver lesion per IR 8/1 - awaiting path results   DVT prophylaxis: SCDs Code Status: FULL CODE Family Communication: no family present at time of exam  Disposition Plan: SDU  Consultants:  [facility]  Procedures: 8/1 US guided liver biopsy   Antimicrobials:    none   Objective: Blood pressure 130/87, pulse 81, temperature 98.3 F (36.8 C), temperature source Oral, resp. rate (!) 24, height 5\' 5"  (1.651 m), weight 74.9 kg (165 lb 2 oz), SpO2 98 %.  Intake/Output Summary (Last 24 hours) at 12/18/15 0923 Last data filed at 12/18/15 0750  Gross per 24 hour  Intake              698 ml  Output             2600 ml  Net            -1902 ml   Filed Weights   12/15/15 1323 12/15/15 2043  Weight: 74.8 kg (165 lb) 74.9 kg (165 lb 2 oz)    Examination: General: No acute respiratory distress Lungs: Clear to auscultation bilaterally  Cardiovascular: Regular rate and rhythm without murmur gallop or rub  Abdomen: distended, mildly tender diffusely, enlarged lobular liver palpable in RUQ, no rebound Extremities: No significant cyanosis, clubbing, edema bilateral lower extremities  CBC:  Recent Labs Lab 12/15/15 1525  12/16/15 1411 12/16/15 2346 12/17/15 0229 12/17/15 1739 12/18/15 0656  WBC 5.2  --  5.9  --  6.7 7.9 8.0  NEUTROABS 3.7  --   --   --   --   --   --   HGB 3.3*  < > 6.5* 7.5* 7.3* 6.2* 8.5*  HCT 14.8*  < >  22.8* 25.6* 25.5* 21.8* 28.5*  MCV 53.6*  --  64.6*  --  65.6* 65.9* 70.2*  PLT 431*  --  375  --  335 261 257  < > = values in this interval not displayed.   Basic Metabolic Panel:  Recent Labs Lab 12/15/15 1525 12/18/15 0656  NA 139 138  K 3.5 3.7  CL 110 108  CO2 22 23  GLUCOSE 111* 89  BUN 12 <5*  CREATININE 0.91 0.88  CALCIUM 8.7* 8.9   GFR: Estimated Creatinine Clearance: 98.2 mL/min (by C-G formula based on SCr of 0.88 mg/dL).  Liver Function Tests:  Recent Labs Lab 12/15/15 1525 12/18/15 0656  AST 16 14*  ALT 12* 11*  ALKPHOS 98 113  BILITOT 0.7 2.5*  PROT 6.4* 5.9*  ALBUMIN 3.2* 2.9*    Recent Labs Lab 12/15/15 1525  LIPASE 31   Coagulation Profile:  Recent Labs Lab 12/17/15 0229  INR 1.08    HbA1C: Hemoglobin A1C  Date/Time Value Ref Range Status  04/20/2012 01:55 PM 5.0   Final     Recent Results (from the past 240 hour(s))  MRSA PCR Screening     Status: None   Collection Time: 12/15/15  8:44 PM  Result Value Ref Range Status   MRSA by PCR NEGATIVE NEGATIVE Final    Comment:        The GeneXpert MRSA Assay (FDA approved for NASAL specimens only), is one component of a comprehensive MRSA colonization surveillance program. It is not intended to diagnose MRSA infection nor to guide or monitor treatment for MRSA infections.      Scheduled Meds: . sodium chloride   Intravenous Once  . cyanocobalamin  1,000 mcg Subcutaneous Daily  . famotidine (PEPCID) IV  20 mg Intravenous Q12H  . folic acid  1 mg Oral Daily  . polyethylene glycol  17 g Oral TID     LOS: 3 days    Cherene Altes, MD [facility] Office  [phone] Pager - Text Page per Amion as per below:  On-Call/Text Page:      [email or web address]      password [email or web address]  If 7PM-7AM, please contact night-coverage [email or web address] Password [facility] 12/18/2015, 9:23 AM

## 2015-12-19 ENCOUNTER — Other Ambulatory Visit: Payer: Self-pay | Admitting: Hematology

## 2015-12-19 DIAGNOSIS — R160 Hepatomegaly, not elsewhere classified: Secondary | ICD-10-CM

## 2015-12-19 DIAGNOSIS — R1084 Generalized abdominal pain: Secondary | ICD-10-CM | POA: Diagnosis present

## 2015-12-19 DIAGNOSIS — C49A0 Gastrointestinal stromal tumor, unspecified site: Secondary | ICD-10-CM

## 2015-12-19 DIAGNOSIS — D500 Iron deficiency anemia secondary to blood loss (chronic): Secondary | ICD-10-CM

## 2015-12-19 DIAGNOSIS — G893 Neoplasm related pain (acute) (chronic): Secondary | ICD-10-CM

## 2015-12-19 DIAGNOSIS — C787 Secondary malignant neoplasm of liver and intrahepatic bile duct: Secondary | ICD-10-CM

## 2015-12-19 LAB — TYPE AND SCREEN
ABO/RH(D): O POS
ANTIBODY SCREEN: NEGATIVE
UNIT DIVISION: 0
UNIT DIVISION: 0
UNIT DIVISION: 0
UNIT DIVISION: 0
Unit division: 0
Unit division: 0
Unit division: 0

## 2015-12-19 LAB — CBC
HCT: 28.1 % — ABNORMAL LOW (ref 39.0–52.0)
Hemoglobin: 8.5 g/dL — ABNORMAL LOW (ref 13.0–17.0)
MCH: 21.6 pg — ABNORMAL LOW (ref 26.0–34.0)
MCHC: 30.2 g/dL (ref 30.0–36.0)
MCV: 71.3 fL — ABNORMAL LOW (ref 78.0–100.0)
PLATELETS: 272 10*3/uL (ref 150–400)
RBC: 3.94 MIL/uL — AB (ref 4.22–5.81)
WBC: 7.8 10*3/uL (ref 4.0–10.5)

## 2015-12-19 MED ORDER — IMATINIB MESYLATE 400 MG PO TABS: 400.0000 mg | ORAL_TABLET | Freq: Every day | ORAL | 1 refills | Status: DC

## 2015-12-19 NOTE — Progress Notes (Signed)
PROGRESS NOTE    Caleb Foley  [id number] DOB: [birth year] DOA: 12/15/2015 PCP: PROVIDER NOT IN SYSTEM   Brief Narrative:  50 y.o.BM PMHx osteoarthritis, chronic back pain, G6PD anemia, and an umbilical hernia   who went to the ED at [facility], [place] due to abdominal pain for two days.  He had been feeling fatigued for 2 months. 2-3 weeks prior to this admit he started noticing abdominal distention and intermittent melena.   In the ED the pt has found to have a hemoglobin of 3.5 g/dL and mild hypoalbuminemia.  CT of the abdomen/pelvis noted a pelvic mass with multiple liver metastasis. Arrangements were made to transfer the patient to [facility].   Subjective: 8/3 A/O 4, negative N/V, positive abdominal pain (described as tightness), negative SOB, negative CP   Assessment & Plan:   Principal Problem:   Severe anemia Active Problems:   Symptomatic anemia   Pelvic mass   Generalized abdominal pain   Liver mass   Liver metastases (HCC)   Gastrointestinal stromal tumor (GIST)   Severe anemia - suspected subacute blood loss on chronic G6PD anemia  Ferritin 8 - Fe level 6 - folate is marginal, and B12 low normal - transfuse prn to keep Hgb 7.0 or > - IV Fe loaded - replace B12 and folate - no evidence of ongoing bleeding at this time   Recent Labs Lab 12/17/15 0229 12/17/15 1739 12/18/15 0656 12/18/15 1825 12/19/15 0350  HGB 7.3* 6.2* 8.5* 9.0* 8.5*     GIB - melena  -FOB is +  - GI has evaluated but does not currently feel that endoscopy is indicated given the overall situation   Pelvic mass w/ multiple liver masses/  -S/p bx of liver lesion per IR 8/1 - awaiting path results  -Alpha-fetoprotein pending -Hepatitis panel pending -CA 19-9 pending -Preliminary read from Dr. Saralyn Pilar pathology: 8/1 liver biopsy positive gastrointestinal stromal tumor -8/3 consulted oncology  Gastrointestinal stromal tumor -see pelvic mass     DVT prophylaxis: SCD Code  Status: Full Family Communication: Brother was present at bedside Disposition Plan: Awaiting pathology findings   Consultants:  Dr Arne Cleveland. IR Dr.Marc Magod [facility] Dr. 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 small bowel of the pelvis.-Given the aggressive appearance of this lesion, primary consideration includes primary mesenteric sarcoma. Lymphoma, carcinoid tumor, desmoid tumor are also in the differential diagnosis. -Numerous large necrotic liver masses, which replaces most of the liver parenchyma with evidence of active extravasation within the largest left hepatic lobe mass 8/1 US guided liver biopsy   Cultures None  Antimicrobials: None   Devices None   LINES / TUBES:  None    Continuous Infusions: . sodium chloride 30 mL/hr at 12/18/15 1211     Objective: Vitals:   12/18/15 2322 12/19/15 0405 12/19/15 0600 12/19/15 0821  BP: 118/70 (!) 137/93 134/84 127/82  Pulse: 72 73 68   Resp: (!) 21 (!) 24 (!) 21   Temp: 98.5 F (36.9 C) 98.8 F (37.1 C)  98.3 F (36.8 C)  TempSrc: Oral Oral  Oral  SpO2: 99%  97% 100%  Weight:      Height:        Intake/Output Summary (Last 24 hours) at 12/19/15 1045 Last data filed at 12/18/15 1800  Gross per 24 hour  Intake  458.25 ml  Output                0 ml  Net           458.25 ml   Filed Weights   12/15/15 1323 12/15/15 2043  Weight: 74.8 kg (165 lb) 74.9 kg (165 lb 2 oz)    Examination:  General: A/O 4, positive abdominal tightness, No acute respiratory distress Eyes: negative scleral hemorrhage, negative anisocoria, negative icterus ENT: Negative Runny nose, negative gingival bleeding, Neck:  Negative scars, masses, torticollis, lymphadenopathy, JVD Lungs: Clear to auscultation bilaterally without  wheezes or crackles Cardiovascular: Regular rate and rhythm without murmur gallop or rub normal S1 and S2 Abdomen: Positive abdominal pain (described as tightness), pain to palpation just superior to the umbilicus, positive significant distention greatest just superior to umbilicus, positive soft, bowel sounds, no rebound, no ascites, positive appreciable mass Extremities: No significant cyanosis, clubbing, or edema bilateral lower extremities Skin: Negative rashes, lesions, ulcers Psychiatric:  Negative depression, negative anxiety, negative fatigue, negative mania  Central nervous system:  Cranial nerves II through XII intact, tongue/uvula midline, all extremities muscle strength 5/5, sensation intact throughout,  negative dysarthria, negative expressive aphasia, negative receptive aphasia.  .     Data Reviewed: Care during the described time interval was provided by me .  I have reviewed this patient's available data, including medical history, events of note, physical examination, and all test results as part of my evaluation. I have personally reviewed and interpreted all radiology studies.  CBC:  Recent Labs Lab 12/15/15 1525  12/17/15 0229 12/17/15 1739 12/18/15 0656 12/18/15 1825 12/19/15 0350  WBC 5.2  < > 6.7 7.9 8.0 8.7 7.8  NEUTROABS 3.7  --   --   --   --   --   --   HGB 3.3*  < > 7.3* 6.2* 8.5* 9.0* 8.5*  HCT 14.8*  < > 25.5* 21.8* 28.5* 30.5* 28.1*  MCV 53.6*  < > 65.6* 65.9* 70.2* 70.9* 71.3*  PLT 431*  < > 335 261 257 272 272  < > = values in this interval not displayed. Basic Metabolic Panel:  Recent Labs Lab 12/15/15 1525 12/18/15 0656  NA 139 138  K 3.5 3.7  CL 110 108  CO2 22 23  GLUCOSE 111* 89  BUN 12 <5*  CREATININE 0.91 0.88  CALCIUM 8.7* 8.9   GFR: Estimated Creatinine Clearance: 98.2 mL/min (by C-G formula based on SCr of 0.88 mg/dL). Liver Function Tests:  Recent Labs Lab 12/15/15 1525 12/18/15 0656  AST 16 14*  ALT 12* 11*  ALKPHOS  98 113  BILITOT 0.7 2.5*  PROT 6.4* 5.9*  ALBUMIN 3.2* 2.9*    Recent Labs Lab 12/15/15 1525  LIPASE 31   No results for input(s): AMMONIA in the last 168 hours. Coagulation Profile:  Recent Labs Lab 12/17/15 0229  INR 1.08   Cardiac Enzymes: No results for input(s): CKTOTAL, CKMB, CKMBINDEX, TROPONINI in the last 168 hours. BNP (last 3 results) No results for input(s): PROBNP in the last 8760 hours. HbA1C: No results for input(s): HGBA1C in the last 72 hours. CBG: No results for input(s): GLUCAP in the last 168 hours. Lipid Profile: No results for input(s): CHOL, HDL, LDLCALC, TRIG, CHOLHDL, LDLDIRECT in the last 72 hours. Thyroid Function Tests: No results for input(s): TSH, T4TOTAL, FREET4, T3FREE, THYROIDAB in the last 72 hours. Anemia Panel: No results for input(s): VITAMINB12, FOLATE, FERRITIN, TIBC, IRON, RETICCTPCT in the last 72 hours.  Urine analysis:    Component Value Date/Time   COLORURINE YELLOW 12/15/2015 1326   APPEARANCEUR CLEAR 12/15/2015 1326   LABSPEC 1.018 12/15/2015 1326   PHURINE 6.0 12/15/2015 1326   GLUCOSEU NEGATIVE 12/15/2015 1326   HGBUR NEGATIVE 12/15/2015 1326   BILIRUBINUR NEGATIVE 12/15/2015 1326   [place] 12/15/2015 1326   PROTEINUR NEGATIVE 12/15/2015 1326   NITRITE NEGATIVE 12/15/2015 1326   LEUKOCYTESUR NEGATIVE 12/15/2015 1326   Sepsis Labs: @LABRCNTIP (procalcitonin:4,lacticidven:4)  ) Recent Results (from the past 240 hour(s))  MRSA PCR Screening     Status: None   Collection Time: 12/15/15  8:44 PM  Result Value Ref Range Status   MRSA by PCR NEGATIVE NEGATIVE Final    Comment:        The GeneXpert MRSA Assay (FDA approved for NASAL specimens only), is one component of a comprehensive MRSA colonization surveillance program. It is not intended to diagnose MRSA infection nor to guide or monitor treatment for MRSA infections.          Radiology Studies: US Biopsy  Result Date:  12/17/2015 INDICATION: 50 year old male with multiple liver lesions. He has been referred for biopsy. EXAM: ULTRASOUND BIOPSY CORE LIVER MEDICATIONS: None. ANESTHESIA/SEDATION: Moderate (conscious) sedation was employed during this procedure. A total of Versed 1.5 mg and Fentanyl 50 mcg was administered intravenously. Moderate Sedation Time: 11 minutes. The patient's level of consciousness and vital signs were monitored continuously by radiology nursing throughout the procedure under my direct supervision. FLUOROSCOPY TIME:  None COMPLICATIONS: None PROCEDURE: Informed written consent was obtained from the patient after a thorough discussion of the procedural risks, benefits and alternatives. All questions were addressed. Maximal Sterile Barrier Technique was utilized including caps, mask, sterile gowns, sterile gloves, sterile drape, hand hygiene and skin antiseptic. A timeout was performed prior to the initiation of the procedure. Patient positioned supine position. Ultrasound survey of the upper abdomen performed with images stored and sent to PACs. The patient is prepped and draped in the usual sterile fashion. The skin and subcutaneous tissues were generously infiltrated 1% lidocaine for local anesthesia. Using ultrasound guidance, a 17 gauge guide needle was advanced into right liver lobe lesion. Once the tip of the needle was confirmed within liver mass, multiple core biopsy were acquired. Three Gel-Foam pledgets were infused with a small amount of saline. The patient tolerated the procedure well and remained hemodynamically stable throughout. No complications were encountered and no significant blood loss was encountered. Final image was stored at the completion the procedure. IMPRESSION: Status post ultrasound-guided core biopsy of liver lesion. Tissue specimen sent to pathology for complete histopathologic analysis. Signed, Dulcy Fanny. Earleen Newport, DO [facility]  Radiology Electronically Signed   By: Corrie Mckusick D.O.   On: 12/17/2015 14:18        Scheduled Meds: . folic acid  1 mg Oral Daily  . pantoprazole  40 mg Oral Q1200  . polyethylene glycol  17 g Oral BID   Continuous Infusions: . sodium chloride 30 mL/hr at 12/18/15 1211     LOS: 4 days    Time spent: 40 minutes/    Daden Mahany, Geraldo Docker, MD [facility] Pager [phone]   If 7PM-7AM, please contact night-coverage [email or web address] Password [email or web address] 12/19/2015, 10:45 AM

## 2015-12-19 NOTE — Plan of Care (Signed)
Problem: Safety: Goal: Ability to remain free from injury will improve Outcome: Completed/Met Date Met: 12/19/15 Pt is able to ambulate independently. Room and walkways clear for pt to be able to walk. Pt knows to ring call light when needing to unhook from monitors to use restroom

## 2015-12-19 NOTE — Consult Note (Signed)
[name] Kitchen    HEMATOLOGY/ONCOLOGY CONSULTATION NOTE  Date of Service: 12/19/2015  Patient Care Team: Provider Not In System as PCP - General  CHIEF COMPLAINTS/PURPOSE OF CONSULTATION:  Newly diagnosed metastatic GIST tumor.  HISTORY OF PRESENTING ILLNESS:   Caleb Foley is a wonderful 50 y.o. male who has been referred to [place] by Dr Allie Bossier, MD  for evaluation and management of newly diagnosed gastrointestinal stromal tumor.  Patient has a history of G6PD deficiency, osteoarthritis and some chronic back pain who presented to the emergency room at [facility] with abdominal pain that was progressively increasing over the last 2-3 months and abdominal distention. Patient reports the pain got too bad for an to tolerate and that's what to come to the emergency room. He has also noted increasing abdominal distention and bloating. He reports intermittent rectal bleeding with dark red/blackish blood for the last 2 weeks/  He notes that he has had decreased appetite but does not report significant weight loss/ no fevers no chills no overt diarrhea no emesis. In the emergency room the patient was noted to have a hemoglobin of 3.5 and a CT scan of the abdomen and pelvis showed a large pelvic mass with multiple liver metastases.   Patient was transferred to [facility] for further management.  Patient has received 7 units of PRBCs thus far and his hemoglobin is now up to 8.5 with an MCV of 71.3. Caleb Foley has apparently received IV Feraheme and is on Folic acid and N98 replacement.  Patient had a biopsy of his liver lesion by interventional radiology on 12/17/2015 that resulted today and was consistent with gastrointestinal stromal tumor. I was consulted for further evaluation and management of his gist tumor.  Patient was seen in the presence of his brother and mother. He notes abdominal discomfort and fullness. Has not noted any active rectal bleeding for the last couple of days. Decreased  appetite.  Patient reports that he is feeling better after the PRBC transfusions.  He notes that he was taking 2-3 tablets of 800 mg of Ibuprofen daily for the last week or so to help with his abdominal pain and it appears that this might have worsened his GI bleeding.  He reports no chest pain or shortness of breath. No headaches. No change in vision no other focal neurological deficits. No focal bone pains.  MEDICAL HISTORY:  Past Medical History:  Diagnosis Date  . Arthritis   . Chronic back pain   . G6PD deficiency anemia ([place])   . Umbilical hernia     SURGICAL HISTORY: Past Surgical History:  Procedure Laterality Date  . HERNIA REPAIR  04/30/11   umb hernia  . LUMBAR LAMINECTOMY/DECOMPRESSION MICRODISCECTOMY  06/17/2012   Procedure: LUMBAR LAMINECTOMY/DECOMPRESSION MICRODISCECTOMY 1 LEVEL;  Surgeon: Winfield Cunas, MD;  Location: [facility];  Service: Neurosurgery;  Laterality: Right;  RIGHT Lumbar Four-five diskectomy    SOCIAL HISTORY: Social History   Social History  . Marital status: Single    Spouse name: N/A  . Number of children: N/A  . Years of education: N/A   Occupational History  . Not on file.   Social History Main Topics  . Smoking status: Current Every Day Smoker    Packs/day: 1.00    Years: 5.00    Types: Cigars  . Smokeless tobacco: Not on file     Comment: smoking 1-2 black n milds a day  occ alcohol weekends   . Alcohol use Yes  Comment: "social"  . Drug use: No  . Sexual activity: Not on file   Other Topics Concern  . Not on file   Social History Narrative  . 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 No significant alcohol use recently previously reports using alcohol on weekends. CAGE unrevealing.   FAMILY HISTORY: History reviewed. No pertinent family history. No family history of cancers or blood disorders    ALLERGIES:  has No Known  Allergies.  MEDICATIONS:  Current Facility-Administered Medications  Medication Dose Route Frequency Provider Last Rate Last Dose  . 0.9 %  sodium chloride infusion   Intravenous Continuous Cherene Altes, MD 30 mL/hr at 12/18/15 1211    . folic acid (FOLVITE) tablet 1 mg  1 mg Oral Daily Kinnie Feil, MD   1 mg at 12/19/15 1324  . morphine 2 MG/ML injection 2 mg  2 mg Intravenous Q2H PRN Cherene Altes, MD      . ondansetron [facility]) tablet 4 mg  4 mg Oral Q6H PRN Reubin Milan, MD       Or  . ondansetron [facility]) injection 4 mg  4 mg Intravenous Q6H PRN Reubin Milan, MD      . oxyCODONE (Oxy IR/ROXICODONE) immediate release tablet 5-10 mg  5-10 mg Oral Q4H PRN Cherene Altes, MD      . pantoprazole (PROTONIX) EC tablet 40 mg  40 mg Oral Q1200 Cherene Altes, MD   40 mg at 12/19/15 1140  . polyethylene glycol (MIRALAX / GLYCOLAX) packet 17 g  17 g Oral BID Cherene Altes, MD      . sodium chloride flush (NS) 0.9 % injection 10-40 mL  10-40 mL Intracatheter PRN Cherene Altes, MD        REVIEW OF SYSTEMS:    10 Point review of Systems was done is negative except as noted above.  PHYSICAL EXAMINATION: ECOG PERFORMANCE STATUS: 1 - Symptomatic but completely ambulatory  . Vitals:   12/19/15 0821 12/19/15 1143  BP: 127/82 133/89  Pulse:  77  Resp:  (!) 26  Temp: 98.3 F (36.8 C) 98 F (36.7 C)   Filed Weights   12/15/15 1323 12/15/15 2043  Weight: 165 lb (74.8 kg) 165 lb 2 oz (74.9 kg)   .Body mass index is 27.48 kg/m.  GENERAL:alert, in mild distress due to abdominal distension. SKIN: skin color, texture, turgor are normal, no rashes or significant lesions EYES: normal, conjunctiva are pink and non-injected, sclera clear OROPHARYNX:no exudate, no erythema and lips, buccal mucosa, and tongue normal  NECK: supple, no JVD, thyroid normal size, non-tender, without nodularity LYMPH:  no palpable lymphadenopathy in the cervical, axillary or  inguinal LUNGS: clear to auscultation with normal respiratory effort HEART: regular rate & rhythm,  no murmurs and no lower extremity edema ABDOMEN: abdomen distended tender to palpation, hard mass in the lower abdomen noted on palpation . Musculoskeletal: no cyanosis of digits and no clubbing  PSYCH: alert & oriented x 3 with fluent speech NEURO: no focal motor/sensory deficits  LABORATORY DATA:  I have reviewed the data as listed  . CBC Latest Ref Rng & Units 12/19/2015 12/18/2015 12/18/2015  WBC 4.0 - 10.5 K/uL 7.8 8.7 8.0  Hemoglobin 13.0 - 17.0 g/dL 8.5(L) 9.0(L) 8.5(L)  Hematocrit 39.0 - 52.0 % 28.1(L) 30.5(L) 28.5(L)  Platelets 150 - 400 K/uL 272 272  257   . CBC    Component Value Date/Time   WBC 7.8 12/19/2015 0350   RBC 3.94 (L) 12/19/2015 0350   HGB 8.5 (L) 12/19/2015 0350   HGB 12.7 (L) 08/30/2012 0809   HCT 28.1 (L) 12/19/2015 0350   HCT 38.4 08/30/2012 0809   PLT 272 12/19/2015 0350   PLT 279 08/30/2012 0809   MCV 71.3 (L) 12/19/2015 0350   MCV 84.0 08/30/2012 0809   MCH 21.6 (L) 12/19/2015 0350   MCHC 30.2 12/19/2015 0350   RDW NOT CALCULATED 12/19/2015 0350   RDW 15.3 (H) 08/30/2012 0809   LYMPHSABS 0.8 12/15/2015 1525   LYMPHSABS 1.7 08/30/2012 0809   MONOABS 0.5 12/15/2015 1525   MONOABS 0.4 08/30/2012 0809   EOSABS 0.1 12/15/2015 1525   EOSABS 0.3 08/30/2012 0809   BASOSABS 0.1 12/15/2015 1525   BASOSABS 0.0 08/30/2012 0809     . CMP Latest Ref Rng & Units 12/18/2015 12/15/2015 05/16/2012  Glucose 65 - 99 mg/dL 89 111(H) 103(H)  BUN 6 - 20 mg/dL <5(L) 12 11.0  Creatinine 0.61 - 1.24 mg/dL 0.88 0.91 0.9  Sodium 135 - 145 mmol/L 138 139 140  Potassium 3.5 - 5.1 mmol/L 3.7 3.5 4.5  Chloride 101 - 111 mmol/L 108 110 107  CO2 22 - 32 mmol/L 23 22 25   Calcium 8.9 - 10.3 mg/dL 8.9 8.7(L) 9.4  Total Protein 6.5 - 8.1 g/dL 5.9(L) 6.4(L) 7.0  Total Bilirubin 0.3 - 1.2 mg/dL 2.5(H) 0.7 0.29  Alkaline Phos 38 - 126 U/L 113 98 111  AST 15 - 41 U/L 14(L) 16 10   ALT 17 - 63 U/L 11(L) 12(L) 16   . Lab Results  Component Value Date   IRON 6 (L) 12/15/2015   TIBC 449 12/15/2015   IRONPCTSAT 1 (L) 12/15/2015   (Iron and TIBC)  Lab Results  Component Value Date   FERRITIN 8 (L) 12/15/2015   Component     Latest Ref Rng & Units 12/15/2015  Vitamin B12     180 - 914 pg/mL 230  Folate     >5.9 ng/mL 5.7 (L)      RADIOGRAPHIC STUDIES: I have personally reviewed the radiological images as listed and agreed with the findings in the report. Ct Abdomen Pelvis W Contrast  Result Date: 12/15/2015 CLINICAL DATA:  Umbilical pain and swelling for 3 weeks. Post umbilical hernia repair. Decreased hemoglobin. EXAM: CT ABDOMEN AND PELVIS WITH CONTRAST TECHNIQUE: Multidetector CT imaging of the abdomen and pelvis was performed using the standard protocol following bolus administration of intravenous contrast. CONTRAST:  1104m ISOVUE-300 IOPAMIDOL (ISOVUE-300) INJECTION 61% COMPARISON:  None. FINDINGS: Lower chest:  No acute findings. Hepatobiliary: The liver is markedly enlarged. The liver parenchyma is largely replaced by a large necrotic-appearing masses with the largest mass in the left lobe of the liver measuring 17 by 17 by 14 cm. Blushes of active extravasation are seen within the left hepatic mass. The gallbladder is decompressed. Probable lymphadenopathy in porta hepaticus. Pancreas: No mass, inflammatory changes, or other significant abnormality. Spleen: Within normal limits in size and appearance. Adrenals/Urinary Tract: No masses identified. No evidence of hydronephrosis. Bilateral renal cysts are seen. Stomach/Bowel: No evidence of obstruction. Vascular/Lymphatic: There is a heterogeneously enhancing macro lobulated mass within the pelvis, superior to the urinary bladder, centered in the mesentery, which measures 8.2 x 1.2 by 7.9 cm. Clear fat plane is seen between this mass and the urinary bladder, and the descending colon. No fat plane separates  this  mass from small bowel loops in the pelvis. No evidence of small-bowel obstruction. Reproductive: The prostate gland is normal. The urinary bladder is decompressed. Other: None. Musculoskeletal:  No suspicious bone lesions identified. IMPRESSION: Large macro lobulated malignant appearing heterogeneous enhancing pelvic mass which seemed to originate within the mesentery. Alternatively it may originate within the small bowel of the pelvis. Given the aggressive appearance of this lesion, primary consideration includes primary mesenteric sarcoma. Lymphoma, carcinoid tumor, desmoid tumor are also in the differential diagnosis. Numerous large necrotic liver masses, which replaces most of the liver parenchyma with evidence of active extravasation within the largest left hepatic lobe mass. These masses are likely metastatic. Surgical consult is recommended. These results were called by telephone at the time of interpretation on 12/15/2015 at 5:41 pm to Dr. Isla Pence , who verbally acknowledged these results. Electronically Signed   By: Fidela Salisbury M.D.   On: 12/15/2015 17:50  US Biopsy  Result Date: 12/17/2015 INDICATION: 50 year old male with multiple liver lesions. He has been referred for biopsy. EXAM: ULTRASOUND BIOPSY CORE LIVER MEDICATIONS: None. ANESTHESIA/SEDATION: Moderate (conscious) sedation was employed during this procedure. A total of Versed 1.5 mg and Fentanyl 50 mcg was administered intravenously. Moderate Sedation Time: 11 minutes. The patient's level of consciousness and vital signs were monitored continuously by radiology nursing throughout the procedure under my direct supervision. FLUOROSCOPY TIME:  None COMPLICATIONS: None PROCEDURE: Informed written consent was obtained from the patient after a thorough discussion of the procedural risks, benefits and alternatives. All questions were addressed. Maximal Sterile Barrier Technique was utilized including caps, mask, sterile gowns, sterile  gloves, sterile drape, hand hygiene and skin antiseptic. A timeout was performed prior to the initiation of the procedure. Patient positioned supine position. Ultrasound survey of the upper abdomen performed with images stored and sent to PACs. The patient is prepped and draped in the usual sterile fashion. The skin and subcutaneous tissues were generously infiltrated 1% lidocaine for local anesthesia. Using ultrasound guidance, a 17 gauge guide needle was advanced into right liver lobe lesion. Once the tip of the needle was confirmed within liver mass, multiple core biopsy were acquired. Three Gel-Foam pledgets were infused with a small amount of saline. The patient tolerated the procedure well and remained hemodynamically stable throughout. No complications were encountered and no significant blood loss was encountered. Final image was stored at the completion the procedure. IMPRESSION: Status post ultrasound-guided core biopsy of liver lesion. Tissue specimen sent to pathology for complete histopathologic analysis. Signed, Dulcy Fanny. Earleen Newport, DO [facility] Radiology Electronically Signed   By: Corrie Mckusick D.O.   On: 12/17/2015 14:18    ASSESSMENT & PLAN:   50 year old AAM with  1) Newly diagnosed metastatic gastrointestinal stromal tumor (likely arising with from the small bowel ) significant liver metastases.  Patient presented with abdominal distention and abdominal pain as well as acute on chronic GI bleeding with a hemoglobin of 3.5 on presentation.  Based on CT abdomen and pelvis the patient has extensive liver metastases which do not appear easily resectable at this time. The primary tumor is rather bulky and symptomatic at this time . Plan -I discussed the pathology results with Dr. Saralyn Pilar . Tumor is strongly c-kit positive . We shall be getting c-kit exon 11, exon 9 and PDGFRA testing on the tumor to characterize potential Gleevec response and  define dosing . -I spent significant time with the patient, his brother and mother discussing the diagnosis, natural  history, prognosis, treatment options and treatment rationale in details . I gave them a printout of the [facility] patient education material on gastrointestinal stromal tumors and also patient education material regarding imatinib . -I discussed that at this time the patient's disease does not appear to be resectable . -Would be reasonable to consult surgery to also comment unresectability and to also have them involved in case the patient has recurrent uncontrollable GI bleeding . -If there is recurrent uncontrollable bleeding and surgery is not feasible might need to consider IR consultation for possible angiographic embolization. -Imatinib 400 mg by mouth daily prescription was ordered and given to a oral chemotherapy pharmacist to work on the preapproval process with the [employer] and mail order pharmacy . Consent was obrtained from the patient to go ahead and order this . We are hoping to start him on the medication in the next couple of weeks if his bleeding has stabilized . -If the patient has exceptional response to treatment might become a surgical candidate in the future though this is difficult to resume. -If he has good response and some residual disease in the liver might consider local ablative therapies or chemoembolization to residual disease in the liver. -Will need a PET CT scan on discharge to define the extent of the disease and to have a baseline to compare future response.  2) severe anemia - predominantly from iron deficiency due to acute on chronic GI bleeding from his gist tumor . Also noted to have low normal B12 and folate levels . Has a history of G6PD deficiency . Plan -Patient has received 7 units of PRBCs at this time and his hemoglobin is 8.5 . -He continues to be at risk for GI bleeding and would transfuse as needed to maintain his hemoglobin  closer to 9 given that he needs the element of safety due to risk of massive acute bleeding . -Patient was previously using ibuprofen prior to admission and he was recommended to absolutely avoid NSAIDs to minimize risk of bleeding . -Received 1 dose of IV Feraheme 510 mg on 12/17/2015 and would recommend repeating this in a week . -Continue oral folate and sublingual vitamin B12 . -Potential terminal small bowel involvement might be a risk factor for B12 deficiency . -If active ongoing bleeding would need to consider angiographic embolization versus palliative surgery . All of the patients questions were answered with apparent satisfaction. The patient knows to call the clinic with any problems, questions or concerns.  3) neoplasm related pain  -Completely avoid NSAIDs due to risk of bleeding. -On when necessary oxycodone and when necessary morphine.  We shall continue to follow the patient while in the hospital. He will need to follow-up with Dr. Irene Limbo in about 2 weeks after discharge for starting his imatinib treatment. He will need to set up a primary care physician which would be important in him being able to get referal's exam for other authorizations.   I spent 80 minutes counseling the patient face to face. The total time spent in the appointment was 80 minutes and more than 50% was on counseling and direct patient cares.    Sullivan Lone MD [place] AAHIVMS [facility] Hematology/Oncology Physician [facility]  (Office):       [phone] (Work cell):  [phone] (Fax):           [phone]  12/19/2015 1:02 PM

## 2015-12-20 ENCOUNTER — Encounter (HOSPITAL_COMMUNITY): Payer: Self-pay | Admitting: *Deleted

## 2015-12-20 ENCOUNTER — Encounter: Payer: Self-pay | Admitting: Pharmacist

## 2015-12-20 LAB — AFP TUMOR MARKER: AFP-Tumor Marker: 1.5 ng/mL (ref 0.0–8.3)

## 2015-12-20 LAB — HEPATITIS PANEL, ACUTE
HCV Ab: <0.1 s/co ratio (ref 0.0–0.9)
HEP A IGM: NEGATIVE
HEP B C IGM: NEGATIVE
HEP B S AG: NEGATIVE

## 2015-12-20 LAB — CANCER ANTIGEN 19-9: CA 19-9: 3 U/mL (ref 0–35)

## 2015-12-20 NOTE — Progress Notes (Signed)
Oral Chemotherapy Pharmacist Encounter   Received Rx for Gleevec 400mg  daily for metastatic GIST Noted that patient is currently admitted for GIB Current labs assessed, OK for chemotherapy Only home medication on file for patient is ibuprofen which does carry risk D DDI with Gleevec Risk Rating D: Consider therapy modification Summary Ibuprofen may decrease the serum concentration of Imatinib. Specifically, ibuprofen may decrease intracellular concentrations of imatinib, leading to decreased clinical response.  Severity Major   Patient will need to be counseled to minimize ibuprofen use, or switch to another NSAID.  I will fax Rx to [facility] Rx for benefit analysis  Johny Drilling, PharmD, BCPS Oral Chemotherapy Clinic

## 2015-12-20 NOTE — Progress Notes (Addendum)
[place] TEAM 1 - Stepdown/ICU TEAM  Dewain Penning  PC:[id number] DOB: 09-Nov-1965 DOA: 12/15/2015 PCP: PROVIDER NOT IN SYSTEM    Brief Narrative:  50 y.o. male with history of osteoarthritis, chronic back pain, G6PD anemia, and an umbilical hernia who went to the ED at [facility] due to abdominal pain for two days.  He had been feeling fatigued for 2 months. 2-3 weeks prior to this admit he started noticing abdominal distention and intermittent melena.   In the ED the pt has found to have a hemoglobin of 3.5 g/dL and mild hypoalbuminemia.  CT of the abdomen/pelvis noted a pelvic mass with multiple liver metastasis. Arrangements were made to transfer the patient to [facility].  Subjective: The patient is sitting up on the side of the bed.  He is very pleasant and conversant.  He denies chest pain shortness breath fevers chills nausea vomiting or abdominal pain at this time.  He states his appetite has improved significantly.  The patient lives in [place].  He is currently employed as a Radiation protection practitioner route Administrator.  He is single and lives alone and has no children.  He does have multiple family members including his mother and sister who live here in [place] and with whom he is very close.  Assessment & Plan:  Newly diagnosed GIST probably of small bowel origin w/ mets to liver  S/p bx of liver lesion per IR 8/1 allwoing diagnosis - Onc now following and planning to initiate chemo - I have asked Gen Surg to evaluate and comment on wether resection is currently a consideration, or if would be consideration should bleeding recur   Severe anemia - suspected subacute blood loss on chronic G6PD anemia  Ferritin 8 - Fe 6 - folate marginal, and B12 low normal - transfuse prn to keep Hgb 7.0 or > but preferably closer to 9 due to high likelihood that bleeding could recur - IV Fe loaded x1 w/ Onc suggesting a second dose one week later (12/24/15)- replace B12 and folate - no evidence of ongoing  bleeding at this time - will transfuse anther unit of PRBC in AM if Hgb remains < 9  Recent Labs Lab 12/17/15 0229 12/17/15 1739 12/18/15 0656 12/18/15 1825 12/19/15 0350  HGB 7.3* 6.2* 8.5* 9.0* 8.5*    GIB - melena  FOB is + - GI has evaluated but does not currently feel that endoscopy is indicated given the overall situation   DVT prophylaxis: SCDs Code Status: FULL CODE Family Communication: no family present at time of exam  Disposition Plan: transfer to medical bed - Gen Surg eval - Onc working on chemo - follow Hgb - possible d/c home in 24-48hrs   Consultants:  [facility]  Gen Surgery   Procedures: 8/1 US guided liver biopsy   Antimicrobials:  none   Objective: Blood pressure (!) 140/94, pulse 79, temperature 99 F (37.2 C), temperature source Oral, resp. rate (!) 28, height 5\' 5"  (1.651 m), weight 74.9 kg (165 lb 2 oz), SpO2 98 %.  Intake/Output Summary (Last 24 hours) at 12/20/15 1040 Last data filed at 12/20/15 1000  Gross per 24 hour  Intake             1530 ml  Output             1875 ml  Net             -345 ml   Filed Weights   12/15/15 1323  12/15/15 2043  Weight: 74.8 kg (165 lb) 74.9 kg (165 lb 2 oz)    Examination: General: No acute respiratory distress - alert and conversant  Lungs: Clear to auscultation bilaterally - no wheeze  Cardiovascular: Regular rate and rhythm without murmur gallop or rub  Abdomen: distended, mildly tender diffusely, enlarged lobular liver palpable in RUQ, no rebound Extremities: No significant cyanosis, clubbing, or edema bilateral lower extremities  CBC:  Recent Labs Lab 12/15/15 1525  12/17/15 0229 12/17/15 1739 12/18/15 0656 12/18/15 1825 12/19/15 0350  WBC 5.2  < > 6.7 7.9 8.0 8.7 7.8  NEUTROABS 3.7  --   --   --   --   --   --   HGB 3.3*  < > 7.3* 6.2* 8.5* 9.0* 8.5*  HCT 14.8*  < > 25.5* 21.8* 28.5* 30.5* 28.1*  MCV 53.6*  < > 65.6* 65.9* 70.2* 70.9* 71.3*  PLT 431*  < > 335 261 257 272  272  < > = values in this interval not displayed.   Basic Metabolic Panel:  Recent Labs Lab 12/15/15 1525 12/18/15 0656  NA 139 138  K 3.5 3.7  CL 110 108  CO2 22 23  GLUCOSE 111* 89  BUN 12 <5*  CREATININE 0.91 0.88  CALCIUM 8.7* 8.9   GFR: Estimated Creatinine Clearance: 98.2 mL/min (by C-G formula based on SCr of 0.88 mg/dL).  Liver Function Tests:  Recent Labs Lab 12/15/15 1525 12/18/15 0656  AST 16 14*  ALT 12* 11*  ALKPHOS 98 113  BILITOT 0.7 2.5*  PROT 6.4* 5.9*  ALBUMIN 3.2* 2.9*    Recent Labs Lab 12/15/15 1525  LIPASE 31   Coagulation Profile:  Recent Labs Lab 12/17/15 0229  INR 1.08    HbA1C: Hemoglobin A1C  Date/Time Value Ref Range Status  04/20/2012 01:55 PM 5.0  Final     Recent Results (from the past 240 hour(s))  MRSA PCR Screening     Status: None   Collection Time: 12/15/15  8:44 PM  Result Value Ref Range Status   MRSA by PCR NEGATIVE NEGATIVE Final    Comment:        The GeneXpert MRSA Assay (FDA approved for NASAL specimens only), is one component of a comprehensive MRSA colonization surveillance program. It is not intended to diagnose MRSA infection nor to guide or monitor treatment for MRSA infections.      Scheduled Meds: . folic acid  1 mg Oral Daily  . pantoprazole  40 mg Oral Q1200  . polyethylene glycol  17 g Oral BID     LOS: 5 days    Cherene Altes, MD [facility] Office  [phone] Pager - Text Page per Amion as per below:  On-Call/Text Page:      [email or web address]      password [email or web address]  If 7PM-7AM, please contact night-coverage [email or web address] Password [email or web address] 12/20/2015, 10:40 AM

## 2015-12-20 NOTE — Consult Note (Signed)
Reason for Consult: Metastatic GIST tumor Referring Physician: Dr. Joette Catching  Caleb Foley is an 50 y.o. male.  HPI: Patient presented to the emergency room at [facility] on 12/15/15 with 2-3 weeks of being markedly distended abdomen and reported pain from his umbilicus going to the right upper quadrant. He always felt bloated.  He was weak with exercise and he noted palpitations with exertion. He denied weight loss, nausea or vomiting.  Some GERD with some foods.  He did fill up easily.   He had been taking ibuprofen without any relief.  Workup in the ED showed a hemoglobin of 3.3, hematocrit are 14.8. Platelets were 431,000. CT scan on 12/15/15 showed a large macrolobulated malignant appearing heterogeneous pelvic mass which seemed to originate within the mesentery. There were number of large necrotic liver masses which is replacing most of the liver parenchyma with evidence of active extravasation within the left hepatic lobe mass. He was admitted by medicine and transfused. He was then seen by Dr. Leonie Douglas GI service. Noted patient has a G6PD deficiency found when he was in the service. He recommended IR biopsy of the liver for possible tissue diagnosis.  Liver biopsy was performed on 12/17/15. His was positive for GIST tumor.  Evaluation by oncology Dr.Gautam Juleen China on 12/19/15. 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 Was his opinion this was not easily resectable, but he recommended surgery consult to review for possible resection. He would also like for [place] to be available should there be recurrent GI bleed.  Past Medical History:  Diagnosis Date  . Arthritis   . Chronic back pain   . 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 Umbilical hernia     Past Surgical History:  Procedure Laterality Date  . HERNIA REPAIR  04/30/11   umb hernia  . LUMBAR LAMINECTOMY/DECOMPRESSION MICRODISCECTOMY   06/17/2012   Procedure: LUMBAR LAMINECTOMY/DECOMPRESSION MICRODISCECTOMY 1 LEVEL;  Surgeon: Winfield Cunas, MD;  Location: [facility];  Service: Neurosurgery;  Laterality: Right;  RIGHT Lumbar Four-five diskectomy    History reviewed. No pertinent family history.  Social History:  reports that he has been smoking Cigars.  He has a 5.00 pack-year smoking history. He has never used smokeless tobacco. He reports that he drinks alcohol. He reports that he does not use drugs.  Allergies: No Known Allergies  Prior to Admission medications   Medication Sig Start Date End Date Taking? Authorizing Provider  ibuprofen (ADVIL,MOTRIN) 800 MG tablet Take 800 mg by mouth daily as needed (pain).   Yes Historical Provider, MD  imatinib (GLEEVEC) 400 MG tablet Take 1 tablet (400 mg total) by mouth daily. Take with meals and large glass of water.Caution:Chemotherapy. 12/19/15   Brunetta Genera, MD     Results for orders placed or performed during the hospital encounter of 12/15/15 (from the past 48 hour(s))  CBC     Status: Abnormal   Collection Time: 12/18/15  6:25 PM  Result Value Ref Range   WBC 8.7 4.0 - 10.5 K/uL   RBC 4.30 4.22 - 5.81 MIL/uL   Hemoglobin 9.0 (L) 13.0 - 17.0 g/dL   HCT 30.5 (L) 39.0 - 52.0 %   MCV 70.9 (L) 78.0 - 100.0 fL   MCH 20.9 (L) 26.0 - 34.0 pg   MCHC 29.5 (L) 30.0 - 36.0 g/dL   RDW NOT CALCULATED 11.5 - 15.5 %   Platelets  272 150 - 400 K/uL    Comment: PLATELET COUNT CONFIRMED BY SMEAR  CBC     Status: Abnormal   Collection Time: 12/19/15  3:50 AM  Result Value Ref Range   WBC 7.8 4.0 - 10.5 K/uL   RBC 3.94 (L) 4.22 - 5.81 MIL/uL    Comment: SCHISTOCYTES NOTED ON SMEAR   Hemoglobin 8.5 (L) 13.0 - 17.0 g/dL   HCT 28.1 (L) 39.0 - 52.0 %   MCV 71.3 (L) 78.0 - 100.0 fL   MCH 21.6 (L) 26.0 - 34.0 pg   MCHC 30.2 30.0 - 36.0 g/dL   RDW NOT CALCULATED 11.5 - 15.5 %   Platelets 272 150 - 400 K/uL  Hepatitis panel, acute     Status: None   Collection Time: 12/19/15   1:15 PM  Result Value Ref Range   Hepatitis B Surface Ag Negative Negative   HCV Ab <0.1 0.0 - 0.9 s/co ratio    Comment: (NOTE)                                  Negative:     < 0.8                             Indeterminate: 0.8 - 0.9                                  Positive:     > 0.9 The CDC recommends that a positive HCV antibody result be followed up with a HCV Nucleic Acid Amplification test [id number]). Performed At: [facility] [place], [place] [id number] Lindon Romp MD [id number]    Hep A IgM Negative Negative   Hep B C IgM Negative Negative  AFP tumor marker     Status: None   Collection Time: 12/19/15  1:15 PM  Result Value Ref Range   AFP-Tumor Marker 1.5 0.0 - 8.3 ng/mL    Comment: (NOTE) Roche ECLIA methodology Performed At: [facility] [place], [place] [id number] Lindon Romp MD [id number]   Cancer antigen 19-9     Status: None   Collection Time: 12/19/15  1:15 PM  Result Value Ref Range   CA 19-9 3 0 - 35 U/mL    Comment: (NOTE) Roche ECLIA methodology Performed At: [facility] [place], [place] [id number] Lindon Romp MD [id number]     No results found.  Review of Systems  Constitutional: Positive for malaise/fatigue. Negative for chills, diaphoresis, fever and weight loss.  HENT: Negative.        He reports some trouble swallowing solids or liquids, but it is intermittent.  Eyes: Negative.   Respiratory: Positive for shortness of breath (DOE, still working at his job.). Negative for cough, hemoptysis, sputum production and wheezing.   Cardiovascular: Positive for palpitations (with exertion) and PND. Negative for orthopnea, claudication and leg swelling.  Gastrointestinal: Positive for abdominal pain, constipation (he is normally daily BM's, a little less regular now since onset of sx.  normal appearing stool, last BM today) and heartburn (intermittent ).  Negative for blood in stool, diarrhea, melena, nausea and vomiting.       Mostly distension and abdominal pain.  He would get very full eating also.  Genitourinary: Negative.  Musculoskeletal: Negative.   Skin: Negative.   Neurological: Positive for weakness.  Endo/Heme/Allergies: Negative.   Psychiatric/Behavioral: Negative.    Blood pressure 116/75, pulse 72, temperature 97.8 F (36.6 C), temperature source Oral, resp. rate 20, height 5\' 5"  (1.651 m), weight 74.9 kg (165 lb 2 oz), SpO2 99 %. Physical Exam  Constitutional: He is oriented to person, place, and time. He appears well-developed and well-nourished. No distress.  HENT:  Head: Normocephalic and atraumatic.  Mouth/Throat: No oropharyngeal exudate.  Eyes: Right eye exhibits no discharge. Left eye exhibits no discharge. No scleral icterus.  Neck: Normal range of motion. Neck supple. No JVD present. No tracheal deviation present. No thyromegaly present.  Cardiovascular: Normal rate, regular rhythm and intact distal pulses.  Exam reveals gallop.   Respiratory: Effort normal and breath sounds normal. No respiratory distress. He has no wheezes. He has no rales.  GI: Soft. Bowel sounds are normal. He exhibits distension and mass. There is tenderness (tender and discomfort from umbilicus to right side.). There is no rebound and no guarding.  Markedly distended, masses palpable both right and left upper abdomen.    The right mass is about 14 x 10 cm  Musculoskeletal: He exhibits no edema or tenderness.  Lymphadenopathy:    He has no cervical adenopathy.  Neurological: He is alert and oriented to person, place, and time. A cranial nerve deficit is present.  Skin: Skin is warm and dry. No rash noted. He is not diaphoretic. No erythema. No pallor.  Psychiatric: He has a normal mood and affect. His behavior is normal. Judgment and thought content normal.   CBC Latest Ref Rng & Units 12/19/2015 12/18/2015 12/18/2015  WBC 4.0 - 10.5 K/uL 7.8  8.7 8.0  Hemoglobin 13.0 - 17.0 g/dL 8.5(L) 9.0(L) 8.5(L)  Hematocrit 39.0 - 52.0 % 28.1(L) 30.5(L) 28.5(L)  Platelets 150 - 400 K/uL 272 272 257   Assessment/Plan: 8.2 x 7.9 x 1.2 pelvic mass  - GIST tumor on pathology report Liver metastasis  Anemia transfused 7 Units PRBC G6PD deficiency anemia Prior umbilical hernia repair Arthritis/back pain  Plan:  Dr. Hulen Skains will review and follow up with you after surgery later today.      Caleb Foley 12/20/2015, 1:21 PM

## 2015-12-21 ENCOUNTER — Other Ambulatory Visit: Payer: Self-pay | Admitting: Hematology

## 2015-12-21 DIAGNOSIS — C49A3 Gastrointestinal stromal tumor of small intestine: Secondary | ICD-10-CM

## 2015-12-21 LAB — CBC
HEMATOCRIT: 31 % — AB (ref 39.0–52.0)
HEMOGLOBIN: 9 g/dL — AB (ref 13.0–17.0)
MCH: 21.4 pg — ABNORMAL LOW (ref 26.0–34.0)
MCHC: 29 g/dL — ABNORMAL LOW (ref 30.0–36.0)
MCV: 73.6 fL — ABNORMAL LOW (ref 78.0–100.0)
Platelets: 251 10*3/uL (ref 150–400)
RBC: 4.21 MIL/uL — AB (ref 4.22–5.81)
WBC: 8.2 10*3/uL (ref 4.0–10.5)

## 2015-12-21 MED ORDER — IMATINIB MESYLATE 400 MG PO TABS: 400.0000 mg | ORAL_TABLET | Freq: Every day | ORAL | 1 refills | Status: DC

## 2015-12-21 MED ORDER — FOLIC ACID 1 MG PO TABS: 1.0000 mg | ORAL_TABLET | Freq: Every day | ORAL | Status: DC

## 2015-12-21 NOTE — Progress Notes (Signed)
Patient discharged to home, VSS, no c/o pain or discomfort. Patient verbalized understanding of all discharge paperwork. Patient counseled on cessation of ibuprofen.

## 2015-12-21 NOTE — Progress Notes (Signed)
Chart check.  We are available for emergency needs surgically.  Otherwise we will not round on this patient daily.  Kathryne Eriksson. Dahlia Bailiff, MD, [place] [phone] [facility] Surgery

## 2015-12-21 NOTE — Discharge Instructions (Signed)
DO NOT TAKE NSAIDs - THIS INCLUDES MOTRIN, IBUPROFEN, ASPIRIN, ALEVE, OR ANY RELATED OVER THE COUNTER PAIN MEDICATION   Gastrointestinal Bleeding  Gastrointestinal bleeding is bleeding somewhere along the path that food travels through the body (digestive tract). This path is anywhere between the mouth and the opening of the butt (anus). You may have blood in your throw up (vomit) or in your poop (stools). If there is a lot of bleeding, you may need to stay in the hospital. [place]  Only take medicine as told by your doctor.  Eat foods with fiber such as whole grains, fruits, and vegetables. You can also try eating 1 to 3 prunes a day.  Drink enough fluids to keep your pee (urine) clear or pale yellow. GET HELP RIGHT AWAY IF:   Your bleeding gets worse.  You feel dizzy, weak, or you pass out (faint).  You have bad cramps in your back or belly (abdomen).  You have large blood clumps (clots) in your poop.  Your problems are getting worse. MAKE SURE YOU:   Understand these instructions.  Will watch your condition.  Will get help right away if you are not doing well or get worse.   This information is not intended to replace advice given to you by your health care provider. Make sure you discuss any questions you have with your health care provider.   Document Released: 02/11/2008 Document Revised: 04/20/2012 Document Reviewed: 10/22/2014 Elsevier Interactive Patient Education [employer].

## 2015-12-21 NOTE — Discharge Summary (Signed)
DISCHARGE SUMMARY  Caleb Foley  MR#: [id number]  DOB:12-01-1965  Date of Admission: 12/15/2015 Date of Discharge: 12/21/2015  Attending Physician:MCCLUNG,JEFFREY T  Patient's [id number] NOT IN SYSTEM  Consults: [facility]   Disposition: D/C home   Follow-up Appts: Follow-up [place], MD. Schedule an appointment as soon as possible for a visit in 7 day(s).   Specialties:  Hematology, Oncology Contact information: [place] [address] [place]. Schedule an appointment as soon as possible for a visit in 5 day(s).   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  He had been feeling fatigued for 2 months. 2-3 weeks prior to this admit he started noticing abdominal distention and intermittent melena.   In the ED the pt has found to have a hemoglobin of 3.5 g/dL and mild hypoalbuminemia.  CT of the abdomen/pelvis noted a pelvic mass with multiple liver metastasis. 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  S/p bx  of liver lesion per IR 8/1 allowoing diagnosis - Onc now following and planning to initiate oral chemo in outpt f/u - Gen Surg has evaluated and feels there is not surgical intervention that would likely be helpful at present, though they have recommended he be evaluated by a Surgical Oncologist at a tertiary care center   Severe anemia - suspected subacute blood loss on chronic G6PD anemia  Ferritin 8 - Fe 6 - folate marginal, and B12 low normal - IV Fe loaded x1 w/ Onc suggesting a second dose one week later (12/24/15) - replaced B12 and folate - no evidence of ongoing bleeding at this time - pt highly motivated to go home today and I feel this is reasonable   GIB - melena  FOB was + - GI has evaluated but does not currently feel that endoscopy is indicated given the overall situation - suspected to have been bleeding from mass in intestines     Medication List    STOP taking these medications   ibuprofen 800 MG tablet Commonly known as:  ADVIL,MOTRIN     TAKE these medications   folic acid 1 MG tablet Commonly known as:  FOLVITE Take 1 tablet (1 mg total) by mouth daily.   imatinib 400 MG tablet Commonly known as:  GLEEVEC Take 1 tablet (400 mg total) by mouth daily. Take with meals and large glass of water.Caution:Chemotherapy.  THIS MEDICATION IS TO BE STARTED ONLY AFTER YOU SEE THE ONCOLOGIST DR. KALE IN FOLLOW UP IN THE  OFFICE       Day of Discharge BP (!) 143/90 (BP Location: Right Arm)   Pulse 70   Temp 97.6 F (36.4 C) (Axillary)   Resp (!) 29   Ht 5\' 5"  (1.651 m)   Wt 74.9 kg (165 lb 2 oz)   SpO2 99%   BMI 27.48 kg/m   Physical Exam: General: No acute respiratory distress Lungs: Clear to auscultation bilaterally without wheezes or crackles Cardiovascular: Regular rate and rhythm without murmur gallop or rub normal S1 and S2 Abdomen: Nontender, nondistended, soft, bowel sounds positive, no rebound, no ascites, no appreciable mass Extremities: No significant  cyanosis, clubbing, or edema bilateral lower extremities  Basic Metabolic Panel:  Recent Labs Lab 12/15/15 1525 12/18/15 0656  NA 139 138  K 3.5 3.7  CL 110 108  CO2 22 23  GLUCOSE 111* 89  BUN 12 <5*  CREATININE 0.91 0.88  CALCIUM 8.7* 8.9    Liver Function Tests:  Recent Labs Lab 12/15/15 1525 12/18/15 0656  AST 16 14*  ALT 12* 11*  ALKPHOS 98 113  BILITOT 0.7 2.5*  PROT 6.4* 5.9*  ALBUMIN 3.2* 2.9*    Recent Labs Lab 12/15/15 1525  LIPASE 31   Coags:  Recent Labs Lab 12/17/15 0229  INR 1.08   CBC:  Recent Labs Lab 12/15/15 1525  12/17/15 1739 12/18/15 0656 12/18/15 1825 12/19/15 0350 12/21/15 0506  WBC 5.2  < > 7.9 8.0 8.7 7.8 8.2  NEUTROABS 3.7  --   --   --   --   --   --   HGB 3.3*  < > 6.2* 8.5* 9.0* 8.5* 9.0*  HCT 14.8*  < > 21.8* 28.5* 30.5* 28.1* 31.0*  MCV 53.6*  < > 65.9* 70.2* 70.9* 71.3* 73.6*  PLT 431*  < > 261 257 272 272 251  < > = values in this interval not displayed.  Recent Results (from the past 240 hour(s))  MRSA PCR Screening     Status: None   Collection Time: 12/15/15  8:44 PM  Result Value Ref Range Status   MRSA by PCR NEGATIVE NEGATIVE Final    Comment:        The GeneXpert MRSA Assay (FDA approved for NASAL specimens only), is one component of a comprehensive MRSA colonization surveillance program. It is not intended to diagnose MRSA infection nor to guide or monitor treatment for MRSA infections.      Time spent in discharge (includes decision making & examination of pt): >30 minutes  12/21/2015, 4:00 PM   Cherene Altes, MD [facility] Office  [phone] Pager [phone]  On-Call/Text Page:      [email or web address]      password [facility]

## 2015-12-23 ENCOUNTER — Encounter: Payer: Self-pay | Admitting: Pharmacist

## 2015-12-23 ENCOUNTER — Other Ambulatory Visit: Payer: Self-pay | Admitting: *Deleted

## 2015-12-23 NOTE — Progress Notes (Signed)
Oral Chemotherapy Pharmacist Encounter   Received notification from [facility] that [facility] will require prior authorization. Notified that PA request was faxed to Dr. Grier Mitts RN's desk.  I followed up with Darden Dates, RN who has passed along the PA request to Raquel. Oral Chemo Clinic will follow-up for PA approval, start date, and initial counseling.  Johny Drilling, PharmD, BCPS Oral Chemotherapy Clinic

## 2015-12-26 ENCOUNTER — Telehealth: Payer: Self-pay | Admitting: Hematology

## 2015-12-26 ENCOUNTER — Encounter: Payer: Self-pay | Admitting: Hematology

## 2015-12-26 NOTE — Telephone Encounter (Signed)
Returned patient's phone call to schedule appt w/Kale. Appointment with Irene Limbo on 8/22 at 11am. Patient aware to arrive 30 minutes early. Demographics verified. Letter mailed to the patient.

## 2015-12-27 ENCOUNTER — Ambulatory Visit: Payer: BLUE CROSS/BLUE SHIELD | Attending: Internal Medicine | Admitting: Physician Assistant

## 2015-12-27 VITALS — BP 150/84 | HR 61 | Temp 98.4°F | Resp 16 | Wt 159.0 lb

## 2015-12-27 DIAGNOSIS — C787 Secondary malignant neoplasm of liver and intrahepatic bile duct: Secondary | ICD-10-CM

## 2015-12-27 DIAGNOSIS — G8929 Other chronic pain: Secondary | ICD-10-CM | POA: Insufficient documentation

## 2015-12-27 DIAGNOSIS — C49A0 Gastrointestinal stromal tumor, unspecified site: Secondary | ICD-10-CM

## 2015-12-27 DIAGNOSIS — D509 Iron deficiency anemia, unspecified: Secondary | ICD-10-CM

## 2015-12-27 DIAGNOSIS — M1990 Unspecified osteoarthritis, unspecified site: Secondary | ICD-10-CM | POA: Insufficient documentation

## 2015-12-27 DIAGNOSIS — R109 Unspecified abdominal pain: Secondary | ICD-10-CM | POA: Diagnosis not present

## 2015-12-27 LAB — COMPREHENSIVE METABOLIC PANEL
ALBUMIN: 3.5 g/dL — AB (ref 3.6–5.1)
ALK PHOS: 130 U/L — AB (ref 40–115)
ALT: 12 U/L (ref 9–46)
AST: 14 U/L (ref 10–40)
BILIRUBIN TOTAL: 0.8 mg/dL (ref 0.2–1.2)
BUN: 8 mg/dL (ref 7–25)
CO2: 24 mmol/L (ref 20–31)
CREATININE: 0.81 mg/dL (ref 0.60–1.35)
Calcium: 9.3 mg/dL (ref 8.6–10.3)
Chloride: 105 mmol/L (ref 98–110)
Glucose, Bld: 87 mg/dL (ref 65–99)
Potassium: 4.2 mmol/L (ref 3.5–5.3)
SODIUM: 140 mmol/L (ref 135–146)
TOTAL PROTEIN: 6.2 g/dL (ref 6.1–8.1)

## 2015-12-27 LAB — CBC WITH DIFFERENTIAL/PLATELET
BASOS PCT: 0 %
Basophils Absolute: 0 cells/uL (ref 0–200)
EOS PCT: 1 %
Eosinophils Absolute: 51 cells/uL (ref 15–500)
HCT: 35.1 % — ABNORMAL LOW (ref 38.5–50.0)
HEMOGLOBIN: 10.7 g/dL — AB (ref 13.2–17.1)
LYMPHS ABS: 612 {cells}/uL — AB (ref 850–3900)
Lymphocytes Relative: 12 %
MCH: 22.3 pg — ABNORMAL LOW (ref 27.0–33.0)
MCHC: 30.5 g/dL — AB (ref 32.0–36.0)
MCV: 73.3 fL — ABNORMAL LOW (ref 80.0–100.0)
MONO ABS: 510 {cells}/uL (ref 200–950)
Monocytes Relative: 10 %
NEUTROS ABS: 3927 {cells}/uL (ref 1500–7800)
Neutrophils Relative %: 77 %
Platelets: 474 10*3/uL — ABNORMAL HIGH (ref 140–400)
RBC: 4.79 MIL/uL (ref 4.20–5.80)
WBC: 5.1 10*3/uL (ref 3.8–10.8)

## 2015-12-27 LAB — VITAMIN B12: VITAMIN B 12: 621 pg/mL (ref 200–1100)

## 2015-12-27 LAB — FOLATE: Folate: >24 ng/mL (ref >5.4)

## 2015-12-27 NOTE — Progress Notes (Signed)
Patient ID: Caleb Foley, male   DOB: 1965-08-16, 50 y.o.   MRN: [id number]   Caleb Foley, is a 50 y.o. male  [id number]  [id number]  DOB - 1965-12-01  Subjective:  Chief Complaint and HPI: Caleb Foley is a 50 y.o. male here today to establish care and for a follow up visit after hospitalization 12/15/2015-12/21/2015. He presented with abdominal pain and bloating with a 2 month history of fatigue.  He had a hemoglobin of 3.5 and was admitted for work-up.  He was given IV Fe and replacement for folate and Vit B12. His Hgb upon discharge was 9.0.  He is currently feeling good and has minimal to no pain.  He denies dizziness or palpitations.  No complaints today.  He is avoiding NSAIDS and doesn't drink alcohol.  He denies melena or hematochezia currently. He has an appointment 01/07/2016 with oncology.    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 w/ metsto liver  S/p bx of liver lesion per IR 8/1 allowoing diagnosis - Onc now following and planning to initiate oral chemo in outpt f/u - Gen Surg has evaluatedand feels there is not surgical intervention that would likely be helpful at present, though they have recommended he be evaluated by a Surgical Oncologist at a tertiary care center   Severe anemia - suspected subacute blood loss on chronic G6PD anemia  Ferritin 8 - Fe 6 - folate marginal, and B12 low normal - IV Fe loaded x1 w/ Onc suggesting a second dose one week later (12/24/15) - replacedB12 and folate - no evidence of ongoing bleeding at this time - pt highly motivated to go home today and I feel this is reasonable   GIB - melena  FOB was + - GI has evaluated but does not currently feel that endoscopy is indicated given the overall situation - suspected to have been bleeding from mass in intestines        ED/Hospital notes and labs reviewed.     ROS:   Constitutional:  No f/c, No night sweats, No unexplained weight loss. +  fatigue EENT:  No vision changes, No blurry vision, No hearing changes. No mouth, throat, or ear problems.  Respiratory: No cough, No SOB Cardiac: No CP, no palpitations GI:  + abd pain, No N/V/D. GU: No Urinary s/sx Musculoskeletal: No joint pain Neuro: No headache, no dizziness, no motor weakness.  Skin: No rash Endocrine:  No polydipsia. No polyuria.  Psych: Denies SI/HI  No problems updated.  ALLERGIES: No Known Allergies  PAST MEDICAL HISTORY: Past Medical History:  Diagnosis Date  . Arthritis   . Chronic back pain   . G6PD deficiency anemia ([place])   . Umbilical hernia     MEDICATIONS AT HOME: Prior to Admission medications   Medication Sig Start Date End Date Taking? Authorizing Provider  folic acid (FOLVITE) 1 MG tablet Take 1 tablet (1 mg total) by mouth daily. 12/21/15  Yes Cherene Altes, MD  imatinib (GLEEVEC) 400 MG tablet Take 1 tablet (400 mg total) by mouth daily. Take with meals and large glass of water.Caution:Chemotherapy.  THIS MEDICATION IS TO BE STARTED ONLY AFTER YOU SEE THE ONCOLOGIST DR. KALE IN FOLLOW UP IN THE OFFICE 12/21/15  Yes Cherene Altes, MD     Objective:  EXAM:   Vitals:   12/27/15 0957  BP: (!) 150/84  Pulse: 61  Resp: 16  Temp: 98.4 F (36.9 C)  TempSrc: Oral  SpO2: 97%  Weight: 159 lb (72.1 kg)    General appearance : A&OX3. NAD. Non-toxic-appearing HEENT: Atraumatic and Normocephalic.  PERRLA. EOM intact. No jaundice/sclera non-icteric.  TM clear B. Mouth-MMM, post pharynx WNL w/o erythema, No PND. Neck: supple, no JVD. No cervical lymphadenopathy. No thyromegaly Chest/Lungs:  Breathing-non-labored, Good air entry bilaterally, breath sounds normal without rales, rhonchi, or wheezing  CVS: S1 S2 regular, no murmurs, gallops, rubs  Abdomen: Bowel sounds present, Non tender and not distended with no gaurding, rigidity or rebound. Extremities: Bilateral Lower Ext shows no edema, both legs are warm to touch with = pulse  throughout Neurology:  CN II-XII grossly intact, Non focal.   Psych:  TP linear. J/I WNL. Normal speech. Appropriate eye contact and affect.  Skin:  No Rash  Data Review Lab Results  Component Value Date   HGBA1C 5.0 04/20/2012     Assessment & Plan   1. Gastrointestinal stromal tumor (GIST)-no pain currently.  Hemodynamically stable See Dr. Irene Limbo 01/07/2016 as scheduled - Comprehensive metabolic panel - Folate - Vitamin B12 PET CT will be ordered by oncology as well as assuring dosing of Feraheme per patient  2. Liver metastases (HCC) - Comprehensive metabolic panel - Folate - Vitamin B12  3. Iron deficiency anemia-G6PD deficiency - CBC with Differential/Platelet - Folate - Vitamin B12  Patient is avoiding NSAIDS and doesn't drink alcohol  Mr. Volbrecht will be followed by our office for General Medical management. He will be having extensive workup in the ensuing weeks and may return here as needed or in 3 months for a complete physical.     Patient have been counseled extensively about nutrition and exercise  Return in about 3 months (around 03/28/2016) for CPE and establish with PCP.  The patient was given clear instructions to go to ER or return to medical center if symptoms don't improve, worsen or new problems develop. The patient verbalized understanding. The patient was told to call to get lab results if they haven't heard anything in the next week.     Freeman Caldron, PA-C [facility] [place], [place]   12/27/2015, 10:40 AM

## 2015-12-27 NOTE — Progress Notes (Signed)
Pt is in the office today for a ED follow up Pt states his pain level today in the office is a 4 Pt states his pain comes from abdomen

## 2016-01-01 ENCOUNTER — Encounter (HOSPITAL_COMMUNITY): Payer: Self-pay

## 2016-01-01 ENCOUNTER — Telehealth: Payer: Self-pay

## 2016-01-01 NOTE — Telephone Encounter (Signed)
Contacted pt to go over lab results pt is aware of results and doesn't have any questions or concerns 

## 2016-01-07 ENCOUNTER — Ambulatory Visit (HOSPITAL_BASED_OUTPATIENT_CLINIC_OR_DEPARTMENT_OTHER): Payer: BLUE CROSS/BLUE SHIELD | Admitting: Hematology

## 2016-01-07 ENCOUNTER — Encounter: Payer: Self-pay | Admitting: Hematology

## 2016-01-07 ENCOUNTER — Encounter (HOSPITAL_COMMUNITY): Payer: Self-pay

## 2016-01-07 ENCOUNTER — Encounter: Payer: Self-pay | Admitting: *Deleted

## 2016-01-07 ENCOUNTER — Ambulatory Visit (HOSPITAL_BASED_OUTPATIENT_CLINIC_OR_DEPARTMENT_OTHER): Payer: BLUE CROSS/BLUE SHIELD

## 2016-01-07 ENCOUNTER — Encounter: Payer: Self-pay | Admitting: Nutrition

## 2016-01-07 VITALS — BP 127/80 | HR 90 | Temp 98.5°F | Resp 18 | Ht 65.0 in | Wt 151.7 lb

## 2016-01-07 DIAGNOSIS — D500 Iron deficiency anemia secondary to blood loss (chronic): Secondary | ICD-10-CM | POA: Diagnosis not present

## 2016-01-07 DIAGNOSIS — D630 Anemia in neoplastic disease: Secondary | ICD-10-CM | POA: Diagnosis not present

## 2016-01-07 DIAGNOSIS — C49A3 Gastrointestinal stromal tumor of small intestine: Secondary | ICD-10-CM | POA: Diagnosis not present

## 2016-01-07 DIAGNOSIS — G893 Neoplasm related pain (acute) (chronic): Secondary | ICD-10-CM | POA: Diagnosis not present

## 2016-01-07 DIAGNOSIS — C787 Secondary malignant neoplasm of liver and intrahepatic bile duct: Secondary | ICD-10-CM

## 2016-01-07 DIAGNOSIS — E43 Unspecified severe protein-calorie malnutrition: Secondary | ICD-10-CM | POA: Diagnosis not present

## 2016-01-07 DIAGNOSIS — E538 Deficiency of other specified B group vitamins: Secondary | ICD-10-CM

## 2016-01-07 LAB — COMPREHENSIVE METABOLIC PANEL
ALBUMIN: 3.3 g/dL — AB (ref 3.5–5.0)
ALK PHOS: 159 U/L — AB (ref 40–150)
ALT: 15 U/L (ref 0–55)
AST: 15 U/L (ref 5–34)
Anion Gap: 8 mEq/L (ref 3–11)
BUN: 9.3 mg/dL (ref 7.0–26.0)
CALCIUM: 10 mg/dL (ref 8.4–10.4)
CO2: 28 mEq/L (ref 22–29)
Chloride: 105 mEq/L (ref 98–109)
Creatinine: 0.8 mg/dL (ref 0.7–1.3)
EGFR: >90 mL/min/{1.73_m2} (ref >90)
Glucose: 75 mg/dl (ref 70–140)
POTASSIUM: 4.4 meq/L (ref 3.5–5.1)
Sodium: 141 mEq/L (ref 136–145)
Total Bilirubin: 0.4 mg/dL (ref 0.20–1.20)
Total Protein: 7.3 g/dL (ref 6.4–8.3)

## 2016-01-07 LAB — CBC & DIFF AND RETIC
BASO%: 0.6 % (ref 0.0–2.0)
BASOS ABS: 0 10*3/uL (ref 0.0–0.1)
EOS ABS: 0.2 10*3/uL (ref 0.0–0.5)
EOS%: 3.7 % (ref 0.0–7.0)
HEMATOCRIT: 35.3 % — AB (ref 38.4–49.9)
HEMOGLOBIN: 10.7 g/dL — AB (ref 13.0–17.1)
LYMPH#: 0.8 10*3/uL — AB (ref 0.9–3.3)
LYMPH%: 15.4 % (ref 14.0–49.0)
MCH: 22.8 pg — AB (ref 27.2–33.4)
MCHC: 30.3 g/dL — ABNORMAL LOW (ref 32.0–36.0)
MCV: 75.3 fL — AB (ref 79.3–98.0)
MONO#: 0.5 10*3/uL (ref 0.1–0.9)
MONO%: 10.7 % (ref 0.0–14.0)
NEUT#: 3.5 10*3/uL (ref 1.5–6.5)
NEUT%: 69.6 % (ref 39.0–75.0)
Platelets: 378 10*3/uL (ref 140–400)
RBC: 4.69 10*6/uL (ref 4.20–5.82)
WBC: 5.1 10*3/uL (ref 4.0–10.3)

## 2016-01-07 LAB — IRON AND TIBC
%SAT: 7 % — ABNORMAL LOW (ref 20–55)
Iron: 26 ug/dL — ABNORMAL LOW (ref 42–163)
TIBC: 366 ug/dL (ref 202–409)
UIBC: 341 ug/dL (ref 117–376)

## 2016-01-07 LAB — FERRITIN: FERRITIN: 87 ng/mL (ref 22–316)

## 2016-01-07 LAB — LACTATE DEHYDROGENASE: LDH: 213 U/L (ref 125–245)

## 2016-01-07 LAB — TECHNOLOGIST REVIEW

## 2016-01-07 MED ORDER — SENNOSIDES-DOCUSATE SODIUM 8.6-50 MG PO TABS: 2.0000 | ORAL_TABLET | Freq: Every evening | ORAL | 1 refills | Status: DC | PRN

## 2016-01-07 MED ORDER — OXYCODONE HCL 5 MG PO TABS: 5.0000 mg | ORAL_TABLET | Freq: Four times a day (QID) | ORAL | 0 refills | Status: DC | PRN

## 2016-01-07 MED ORDER — ONDANSETRON HCL 8 MG PO TABS: 8.0000 mg | ORAL_TABLET | Freq: Three times a day (TID) | ORAL | 3 refills | Status: DC | PRN

## 2016-01-07 NOTE — Progress Notes (Signed)
I advised Caleb Foley I can't verify his insurance to do prior auth. I noted for front end to scan card and let me know. The card in system is not coming up valid to do prior auth. Needing prior auth for gleevec

## 2016-01-07 NOTE — Progress Notes (Signed)
Provided patient with one complimentary case Ensure Plus.

## 2016-01-07 NOTE — Progress Notes (Signed)
[employer] form completed by Manuela Schwartz. I sent to medical recrds and faxed [phone]

## 2016-01-07 NOTE — Progress Notes (Signed)
Oncology Nurse Navigator Documentation  Oncology Nurse Navigator Flowsheets 01/07/2016  Navigator Location [facility]  Navigator Encounter Type Initial MedOnc  Abnormal Finding Date 12/16/2015  Confirmed Diagnosis Date 12/17/2015  Patient Visit Type MedOnc;Follow-up  Treatment Phase Pre-Tx/Tx Discussion  Barriers/Navigation Needs Coordination of Care;Education;Financial  Education Contractor;Coping with Diagnosis/ Prognosis;Pain/ Symptom Management;Newly Diagnosed Cancer Education;Preparing for Upcoming Surgery/ Treatment;Other--Short term disability form completion  Interventions Coordination of Care;Education Method  Coordination of Care Radiology;Appts--scheduled chemo class for 01/09/16 and inbasket to managed care to obtain authorization for PET  Education Method Verbal;Written;Caleb Foley-back  Support Groups/Services GI Support Group;Caleb Foley;Other--[facility] Support Services  Acuity Level 2  Time Spent with Patient 60  Met with patient during new patient/hospital follow up visit with Dr. Irene Limbo.  Explained the role of the GI Nurse Navigator and provided New Patient Packet with information on: 1.  GIST* cancer--reviewed info on Gleevec and support services for GIST patients; info on PET scan 2. Support groups 3. Advanced Directives 4. Fall Safety Plan Answered questions, reviewed current treatment plan using Caleb Foley back and provided emotional support. Provided copy of current treatment plan. Explained use of pain medications and constipation prevention. Currently his pain is at night when trying to rest-abdomen is distended and tender. He does not plan to take the narcotics at night. INstructed him to call office when he receives his [place] so the start date can be documented. Caleb Foley comes to appointment alone today: he has a girlfriend,but lives alone (does not wish to add her to his ROI). 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 He says he wants to get a dog, but is waiting to have his backyard fenced in. He spent 10 years in the [employer], which he says he enjoyed a lot. Completed his disability form he brought to appointment and MD signed. Copy sent to managed care. Escorted him to lab after his appointment and will follow up on PET scan.  Caleb Elks, RN, BSN GI Oncology [place]

## 2016-01-07 NOTE — Progress Notes (Signed)
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 He had presented with progressively increasing abdominal pain for 2-3 months and abdominal distention. He also had significant rectal bleeding and had presented with a hemoglobin of 3.5 and a CT of the abdomen and pelvis showed a large pelvic mass with multiple liver metastases. He received 7 units of PRBCs as well as IV Feraheme for his blood loss anemia.  biopsy of his liver lesion was consistent with gastrointestinal stromal tumor. He was also taking significant amounts of ibuprofen for his abdominal pain which was also thought to be an factor causing GI bleeding.   Patient notes that since his been off of the hospital he has lost another 15 pounds of weight unintentionally. Status post significant abdominal distention. Notes his appetite is good but he has early satiety due to abdominal pressure.  Pharmacies working on trying to get his [place] approved. We discussed additional questions he had after reading the material on gastrointestinal stromal tumors and Gleevec did have given to him during the hospitalization. No overt GI bleeding noted since his discharge. Notes 3-4 out of 10 abdominal pain on occasion. Sometimes worse. No constipation.   REVIEW OF SYSTEMS:    10 Point review of systems of done and is negative except as noted above.  . Past Medical History:  Diagnosis Date  . Arthritis   . Chronic back pain   . G6PD deficiency anemia ([place])   . Umbilical hernia     . Past Surgical History:  Procedure Laterality Date  .  HERNIA REPAIR  04/30/11   umb hernia  . LUMBAR LAMINECTOMY/DECOMPRESSION MICRODISCECTOMY  06/17/2012   Procedure: LUMBAR LAMINECTOMY/DECOMPRESSION MICRODISCECTOMY 1 LEVEL;  Surgeon: Winfield Cunas, MD;  Location: [facility];  Service: Neurosurgery;  Laterality: Right;  RIGHT Lumbar Four-five diskectomy    . Social History  Substance Use Topics  . Smoking status: Current Every Day Smoker    Packs/day: 1.00    Years: 5.00    Types: Cigars  . Smokeless tobacco: Never Used     Comment: smoking 1-2 black n milds a day  occ alcohol weekends   . Alcohol use Yes     Comment: "social"    ALLERGIES:  has No Known Allergies.  MEDICATIONS:  Current Outpatient Prescriptions  Medication Sig Dispense Refill  . folic acid (FOLVITE) 1 MG tablet Take 1 tablet (1 mg total) by mouth daily.    Caleb Caleb Foley imatinib (GLEEVEC) 400 MG tablet Take 1 tablet (400 mg total) by mouth daily. Take with meals and large glass of water.Caution:Chemotherapy.  THIS MEDICATION IS TO BE STARTED ONLY AFTER YOU SEE THE ONCOLOGIST DR. Trelon Plush IN FOLLOW UP IN THE OFFICE 30 tablet 1  . ondansetron (ZOFRAN) 8 MG tablet Take 1 tablet (8 mg total) by mouth every 8 (eight) hours as needed for nausea or vomiting. 30 tablet 3  . oxyCODONE (OXY IR/ROXICODONE) 5 MG immediate release tablet Take 1 tablet (5 mg total) by mouth every 6 (six) hours as needed for moderate pain or severe pain. 30 tablet 0  . senna-docusate (SENNA S) 8.6-50 MG tablet Take  2 tablets by mouth at bedtime as needed for mild constipation or moderate constipation. 60 tablet 1   No current facility-administered medications for this visit.     PHYSICAL EXAMINATION: ECOG PERFORMANCE STATUS: 1 - Symptomatic but completely ambulatory  . Vitals:   01/07/16 1111  BP: 127/80  Pulse: 90  Resp: 18  Temp: 98.5 F (36.9 C)    Filed Weights   01/07/16 1111  Weight: 151 lb 11.2 oz (68.8 kg)   .Body mass index is 25.24 kg/m.  GENERAL:alert, in no acute distress and  comfortable SKIN: skin color, texture, turgor are normal, no rashes or significant lesions EYES: normal, conjunctiva are pink and non-injected, sclera clear OROPHARYNX:no exudate, no erythema and lips, buccal mucosa, and tongue normal  NECK: supple, no JVD, thyroid normal size, non-tender, without nodularity LYMPH:  no palpable lymphadenopathy in the cervical, axillary or inguinal LUNGS: clear to auscultation with normal respiratory effort HEART: regular rate & rhythm,  no murmurs and no lower extremity edema ABDOMEN: abd distended with palpable hard mass in the middle to lower abdomen PSYCH: alert & oriented x 3 with fluent speech NEURO: no focal motor/sensory deficits  LABORATORY DATA:   I have reviewed the data as listed  . CBC Latest Ref Rng & Units 01/07/2016 12/27/2015 12/21/2015  WBC 4.0 - 10.3 10e3/uL 5.1 5.1 8.2  Hemoglobin 13.0 - 17.1 g/dL 10.7(L) 10.7(L) 9.0(L)  Hematocrit 38.4 - 49.9 % 35.3(L) 35.1(L) 31.0(L)  Platelets 140 - 400 10e3/uL 378 474(H) 251    . CMP Latest Ref Rng & Units 12/27/2015 12/18/2015 12/15/2015  Glucose 65 - 99 mg/dL 87 89 111(H)  BUN 7 - 25 mg/dL 8 <5(L) 12  Creatinine 0.60 - 1.35 mg/dL 0.81 0.88 0.91  Sodium 135 - 146 mmol/L 140 138 139  Potassium 3.5 - 5.3 mmol/L 4.2 3.7 3.5  Chloride 98 - 110 mmol/L 105 108 110  CO2 20 - 31 mmol/L 24 23 22   Calcium 8.6 - 10.3 mg/dL 9.3 8.9 8.7(L)  Total Protein 6.1 - 8.1 g/dL 6.2 5.9(L) 6.4(L)  Total Bilirubin 0.2 - 1.2 mg/dL 0.8 2.5(H) 0.7  Alkaline Phos 40 - 115 U/L 130(H) 113 98  AST 10 - 40 U/L 14 14(L) 16  ALT 9 - 46 U/L 12 11(L) 12(L)       RADIOGRAPHIC STUDIES: I have personally reviewed the radiological images as listed and agreed with the findings in the report. Ct Abdomen Pelvis W Contrast  Result Date: 12/15/2015 CLINICAL DATA:  Umbilical pain and swelling for 3 weeks. Post umbilical hernia repair. Decreased hemoglobin. EXAM: CT ABDOMEN AND PELVIS WITH CONTRAST TECHNIQUE: Multidetector CT  imaging of the abdomen and pelvis was performed using the standard protocol following bolus administration of intravenous contrast. CONTRAST:  164m ISOVUE-300 IOPAMIDOL (ISOVUE-300) INJECTION 61% COMPARISON:  None. FINDINGS: Lower chest:  No acute findings. Hepatobiliary: The liver is markedly enlarged. The liver parenchyma is largely replaced by a large necrotic-appearing masses with the largest mass in the left lobe of the liver measuring 17 by 17 by 14 cm. Blushes of active extravasation are seen within the left hepatic mass. The gallbladder is decompressed. Probable lymphadenopathy in porta hepaticus. Pancreas: No mass, inflammatory changes, or other significant abnormality. Spleen: Within normal limits in size and appearance. Adrenals/Urinary Tract: No masses identified. No evidence of hydronephrosis. Bilateral renal cysts are seen. Stomach/Bowel: No evidence of obstruction. Vascular/Lymphatic: There is a heterogeneously enhancing macro lobulated mass within the pelvis, superior to the urinary bladder, centered in the mesentery,  which measures 8.2 x 1.2 by 7.9 cm. Clear fat plane is seen between this mass and the urinary bladder, and the descending colon. No fat plane separates this mass from small bowel loops in the pelvis. No evidence of small-bowel obstruction. Reproductive: The prostate gland is normal. The urinary bladder is decompressed. Other: None. Musculoskeletal:  No suspicious bone lesions identified. IMPRESSION: Large macro lobulated malignant appearing heterogeneous enhancing pelvic mass which seemed to originate within the mesentery. Alternatively it may originate within the small bowel of the pelvis. Given the aggressive appearance of this lesion, primary consideration includes primary mesenteric sarcoma. Lymphoma, carcinoid tumor, desmoid tumor are also in the differential diagnosis. Numerous large necrotic liver masses, which replaces most of the liver parenchyma with evidence of active  extravasation within the largest left hepatic lobe mass. These masses are likely metastatic. Surgical consult is recommended. These results were called by telephone at the time of interpretation on 12/15/2015 at 5:41 pm to Dr. Isla Pence , who verbally acknowledged these results. Electronically Signed   By: Fidela Salisbury M.D.   On: 12/15/2015 17:50  US Biopsy  Result Date: 12/17/2015 INDICATION: 50 year old male with multiple liver lesions. He has been referred for biopsy. EXAM: ULTRASOUND BIOPSY CORE LIVER MEDICATIONS: None. ANESTHESIA/SEDATION: Moderate (conscious) sedation was employed during this procedure. A total of Versed 1.5 mg and Fentanyl 50 mcg was administered intravenously. Moderate Sedation Time: 11 minutes. The patient's level of consciousness and vital signs were monitored continuously by radiology nursing throughout the procedure under my direct supervision. FLUOROSCOPY TIME:  None COMPLICATIONS: None PROCEDURE: Informed written consent was obtained from the patient after a thorough discussion of the procedural risks, benefits and alternatives. All questions were addressed. Maximal Sterile Barrier Technique was utilized including caps, mask, sterile gowns, sterile gloves, sterile drape, hand hygiene and skin antiseptic. A timeout was performed prior to the initiation of the procedure. Patient positioned supine position. Ultrasound survey of the upper abdomen performed with images stored and sent to PACs. The patient is prepped and draped in the usual sterile fashion. The skin and subcutaneous tissues were generously infiltrated 1% lidocaine for local anesthesia. Using ultrasound guidance, a 17 gauge guide needle was advanced into right liver lobe lesion. Once the tip of the needle was confirmed within liver mass, multiple core biopsy were acquired. Three Gel-Foam pledgets were infused with a small amount of saline. The patient tolerated the procedure well and remained hemodynamically  stable throughout. No complications were encountered and no significant blood loss was encountered. Final image was stored at the completion the procedure. IMPRESSION: Status post ultrasound-guided core biopsy of liver lesion. Tissue specimen sent to pathology for complete histopathologic analysis. Signed, Dulcy Fanny. Earleen Newport, DO [facility] Radiology Electronically Signed   By: Corrie Mckusick D.O.   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 -We'll start the patient on imatinib 400 mg by mouth daily . There is data that patients with gist and Kit exon 9 mutation might respond better to 800 mg daily off imatinib .we shall consider dose escalation if the patient tolerates the initial dose . -Not a candidate for  surgery at this time . -Given a case of ensure for nutritional supplementation . -Referral given to Ernestene Kiel for nutritional consultation . -Chemotherapy counseling for imatinib  -Given prescription for when necessary oxycodone for moderate to severe pain as well as senna S for bowel prophylaxis . -When necessary Zofran for nausea.  #4  microcytic anemia due to blood loss anemia and iron deficiency as well as anemia of chronic disease due to metastatic malignancy . #5 B12 deficiency  #6 Folate deficiency  Plan -We'll get CBC, CMP, ferritin, iron profile today . -Continue B12 and folate replacement .  Return to care with Dr. Irene Limbo in 3 weeks about 2 weeks after starting imatinib with repeat labs. Merceda Elks RN present during this visit  for GI navigator role.  I spent 40 minutes counseling the patient face to face. The total time spent in the appointment was 40 minutes and more than 50% was on counseling and direct patient cares.    Sullivan Lone MD [place] AAHIVMS [facility] Hematology/Oncology Physician [facility]  (Office):       [phone] (Work cell):  [phone] (Fax):           [phone]

## 2016-01-07 NOTE — Patient Instructions (Signed)
Care Plan Summary- 01/07/2016 Name:  Toua Droge       DOB:  [birth year] Your Medical Team: Medical Oncologist:  Dr. Sullivan Lone Radiation Oncologist:  Surgeon:    Type of Cancer: Gastrointestinal Stromal Tumor (GIST)-metastatic to liver  Stage/Grade:  *Exact staging of your cancer is based on size of the tumor, depth of invasion, involvement of lymph nodes or not, and whether or not the cancer has spread beyond the primary site   Recommendations: Based on information available as of today's consult. Recommendations may change depending on the results of further tests or exams. 1) Chemotherapy with oral drug Gleevec (Imatinib) 400 mg daily 2) PET scan for staging 3) Oxycodone 5mg  every 6 hours as needed for moderate pain (can cause constipation) 4) Senna-S (OTC) take 1-2/day if needed to keep bowels moving Next Steps:       1)  Labs today and schedule for chemotherapy education class; dietician referral 2) Radiology will call with PET scan appointment 3) Return to Dr. Irene Limbo in few weeks; call office when you obtain your Imatinib   Questions? Merceda Elks, RN, BSN at [phone]. Manuela Schwartz is your Oncology Nurse Navigator and is available to assist you while you're receiving your medical care at [facility].

## 2016-01-07 NOTE — Progress Notes (Signed)
form left in pod- left for dr. Jana Hakim to sign- faxed to [employer]-copy to medical recrds and left copy at front for patient

## 2016-01-08 ENCOUNTER — Telehealth: Payer: Self-pay | Admitting: *Deleted

## 2016-01-08 LAB — RETICULOCYTES: RETICULOCYTE COUNT: 0.4 % — AB (ref 0.6–2.6)

## 2016-01-08 NOTE — Telephone Encounter (Signed)
Oncology Nurse Navigator Documentation  Oncology Nurse Navigator Flowsheets 01/08/2016  Navigator Location [facility]  Navigator Encounter Type Telephone  Telephone Appt Confirmation/Clarification==PET  Abnormal Finding Date -  Confirmed Diagnosis Date -  Patient Visit Type -  Treatment Phase -  Barriers/Navigation Needs -  Education -  Interventions -  Coordination of Care -Confirmed that PET was approved and scheduled for 8/29 at 0630/0700 at [facility]--patient notified and appointment information printed for education nurse to give him at chemo class tomorrow  Education Method -  Support Groups/Services -  Acuity -  Time Spent with Patient -  Authorization for [place] still pending.

## 2016-01-09 ENCOUNTER — Ambulatory Visit: Payer: BLUE CROSS/BLUE SHIELD

## 2016-01-09 ENCOUNTER — Encounter: Payer: Self-pay | Admitting: *Deleted

## 2016-01-10 ENCOUNTER — Encounter: Payer: Self-pay | Admitting: Pharmacist

## 2016-01-10 ENCOUNTER — Telehealth: Payer: Self-pay | Admitting: Pharmacist

## 2016-01-10 ENCOUNTER — Encounter: Payer: Self-pay | Admitting: Hematology

## 2016-01-10 ENCOUNTER — Telehealth: Payer: Self-pay | Admitting: *Deleted

## 2016-01-10 NOTE — Telephone Encounter (Signed)
  Oncology Nurse Navigator Documentation  Navigator Location: [facility] (01/10/16 0952) Navigator Encounter Type: Telephone (01/10/16 [id number]) Telephone: Outgoing Call;Medication Assistance (01/10/16 [id number])   Confirmed with managed care that his [place] has been authorized. [place] to process script.

## 2016-01-10 NOTE — Telephone Encounter (Signed)
Oral Chemotherapy Pharmacist Encounter   Received call from patient in the Oral Chemo Clinic requesting status of prior authorization for Gleevec medication. I had just received notification from [place] that PA had been approved, medication was filled, but co-pay $200. They are currently looking into options for co-pay assistance.  I informed patient that either Oral Chemo Clinic or [facility] would be getting in touch with him as soon as possible with information about co-pay assistance.  Oral Chemo Clinic will continue to follow.  Johny Drilling, PharmD, BCPS Oral Chemotherapy Clinic

## 2016-01-10 NOTE — Progress Notes (Signed)
form left in pod- left for dr. Jana Hakim to sign- faxed to [employer]-copy to medical recrds and left copy at front for patient- per [facility] gleevec approved 01/09/16-01/08/17 sent to medical recds

## 2016-01-10 NOTE — Progress Notes (Signed)
Oral Chemotherapy Pharmacist Encounter   Received notification for [facility] that they had found a co-pay assistance card and now [name] co-pay = $0 They have reached out to patient with the good news. Rx will be ready for p/u on 01/13/16.  Oral Chemo Clinic will continue to follow.  Johny Drilling, PharmD, BCPS Oral Chemotherapy Clinic

## 2016-01-13 ENCOUNTER — Telehealth: Payer: Self-pay | Admitting: Hematology

## 2016-01-13 NOTE — Telephone Encounter (Signed)
APPT PER 08/22 LOS CONF WITH PATIENT. 01/13/16

## 2016-01-14 ENCOUNTER — Encounter (HOSPITAL_COMMUNITY)
Admission: RE | Admit: 2016-01-14 | Discharge: 2016-01-14 | Disposition: A | Discharge status: Home / Self Care | Payer: BLUE CROSS/BLUE SHIELD | Source: Ambulatory Visit | Attending: Hematology | Admitting: Hematology

## 2016-01-14 ENCOUNTER — Telehealth: Payer: Self-pay | Admitting: *Deleted

## 2016-01-14 DIAGNOSIS — C787 Secondary malignant neoplasm of liver and intrahepatic bile duct: Secondary | ICD-10-CM | POA: Insufficient documentation

## 2016-01-14 DIAGNOSIS — C49A3 Gastrointestinal stromal tumor of small intestine: Secondary | ICD-10-CM | POA: Insufficient documentation

## 2016-01-14 LAB — GLUCOSE, CAPILLARY: GLUCOSE-CAPILLARY: 107 mg/dL — AB (ref 65–99)

## 2016-01-14 MED ORDER — FLUDEOXYGLUCOSE F - 18 (FDG) INJECTION
7.2600 | Freq: Once | INTRAVENOUS | Status: AC | PRN
  Administered 2016-01-14: 7.26 via INTRAVENOUS

## 2016-01-14 NOTE — Telephone Encounter (Signed)
  Oncology Nurse Navigator Documentation  Navigator Location: [facility] (01/14/16 [id number]) Navigator Encounter Type: Telephone (01/14/16 0933) Telephone: Outgoing Call;Medication Assistance (01/14/16 0933)   Caleb Foley picked up his [place] today with a 0 copay. He plans to begin medication today. Just left from his PET scan and requests to be called with result when available. Forwarded this request to collaborative nurse.                                      Time Spent with Patient: 15 (01/14/16 [id number])

## 2016-01-22 ENCOUNTER — Other Ambulatory Visit: Payer: BLUE CROSS/BLUE SHIELD

## 2016-01-22 ENCOUNTER — Ambulatory Visit (HOSPITAL_BASED_OUTPATIENT_CLINIC_OR_DEPARTMENT_OTHER): Payer: BLUE CROSS/BLUE SHIELD | Admitting: Nutrition

## 2016-01-22 NOTE — Progress Notes (Signed)
50 year old male diagnosed with GIST.  Patient of Dr. Julieanne Manson.  Past medical history includes tobacco, osteoarthritis and chronic back pain.  Medications include Folvite and Gleevec.  Labs include albumin 3.3 on August 22.  Height: 65 inches. Weight: 158.4 pounds on September 6. Usual body weight: 165 pounds. BMI: 26.36.  Patient reports his poor appetite has resolved and he is eating well. He has had a history of constipation but currently that is not a problem. Patient denies other nutrition impact symptoms. He is drinking one Ensure Plus daily.  Nutrition diagnosis:  Food and nutrition related knowledge deficit related to GIST as evidenced by no prior need for nutrition related information.  Intervention: Patient was educated to consume small frequent meals and snacks utilizing high-calorie, high-protein foods as tolerated. Encouraged patient to continue to promote weight maintenance/weight gain. Provided second complimentary case of Ensure Plus. Provided fact sheets and education on increasing calories and protein, and constipation. Questions were answered.  Teach back method used.  Contact patient given.  Monitoring, evaluation, goals: Patient will tolerate adequate calories and protein for minimal weight loss.  Next visit: Patient to contact me as needed.  **Disclaimer: This note was dictated with voice recognition software. Similar sounding words can inadvertently be transcribed and this note may contain transcription errors which may not have been corrected upon publication of note.**

## 2016-01-28 ENCOUNTER — Ambulatory Visit: Payer: BLUE CROSS/BLUE SHIELD

## 2016-01-28 ENCOUNTER — Other Ambulatory Visit: Payer: Self-pay | Admitting: *Deleted

## 2016-01-28 ENCOUNTER — Other Ambulatory Visit (HOSPITAL_BASED_OUTPATIENT_CLINIC_OR_DEPARTMENT_OTHER): Payer: BLUE CROSS/BLUE SHIELD | Admitting: *Deleted

## 2016-01-28 ENCOUNTER — Other Ambulatory Visit (HOSPITAL_BASED_OUTPATIENT_CLINIC_OR_DEPARTMENT_OTHER): Payer: BLUE CROSS/BLUE SHIELD

## 2016-01-28 ENCOUNTER — Ambulatory Visit (HOSPITAL_BASED_OUTPATIENT_CLINIC_OR_DEPARTMENT_OTHER): Payer: BLUE CROSS/BLUE SHIELD | Admitting: Hematology

## 2016-01-28 ENCOUNTER — Encounter: Payer: Self-pay | Admitting: Hematology

## 2016-01-28 ENCOUNTER — Telehealth: Payer: Self-pay | Admitting: Pharmacist

## 2016-01-28 VITALS — BP 111/75 | HR 79 | Temp 98.5°F | Resp 16

## 2016-01-28 DIAGNOSIS — D630 Anemia in neoplastic disease: Secondary | ICD-10-CM

## 2016-01-28 DIAGNOSIS — C49A3 Gastrointestinal stromal tumor of small intestine: Secondary | ICD-10-CM | POA: Diagnosis not present

## 2016-01-28 DIAGNOSIS — D509 Iron deficiency anemia, unspecified: Secondary | ICD-10-CM

## 2016-01-28 DIAGNOSIS — D500 Iron deficiency anemia secondary to blood loss (chronic): Secondary | ICD-10-CM

## 2016-01-28 DIAGNOSIS — C787 Secondary malignant neoplasm of liver and intrahepatic bile duct: Secondary | ICD-10-CM

## 2016-01-28 LAB — CBC & DIFF AND RETIC
BASO%: 0.7 % (ref 0.0–2.0)
Basophils Absolute: 0 10*3/uL (ref 0.0–0.1)
EOS%: 3.2 % (ref 0.0–7.0)
Eosinophils Absolute: 0.1 10*3/uL (ref 0.0–0.5)
HCT: 25.6 % — ABNORMAL LOW (ref 38.4–49.9)
HGB: 7.8 g/dL — ABNORMAL LOW (ref 13.0–17.1)
IMMATURE RETIC FRACT: 12.5 % — AB (ref 3.00–10.60)
LYMPH%: 15.1 % (ref 14.0–49.0)
MCH: 23.2 pg — AB (ref 27.2–33.4)
MCHC: 30.5 g/dL — AB (ref 32.0–36.0)
MCV: 76.2 fL — AB (ref 79.3–98.0)
MONO#: 0.3 10*3/uL (ref 0.1–0.9)
MONO%: 6 % (ref 0.0–14.0)
NEUT#: 3.3 10*3/uL (ref 1.5–6.5)
NEUT%: 75 % (ref 39.0–75.0)
PLATELETS: 357 10*3/uL (ref 140–400)
RBC: 3.36 10*6/uL — AB (ref 4.20–5.82)
RDW: 21.9 % — ABNORMAL HIGH (ref 11.0–14.6)
Retic %: 1.14 % (ref 0.80–1.80)
Retic Ct Abs: 38.3 10*3/uL (ref 34.80–93.90)
WBC: 4.4 10*3/uL (ref 4.0–10.3)
lymph#: 0.7 10*3/uL — ABNORMAL LOW (ref 0.9–3.3)

## 2016-01-28 LAB — COMPREHENSIVE METABOLIC PANEL
ALT: 14 U/L (ref 0–55)
ANION GAP: 7 meq/L (ref 3–11)
AST: 12 U/L (ref 5–34)
Albumin: 2.8 g/dL — ABNORMAL LOW (ref 3.5–5.0)
Alkaline Phosphatase: 113 U/L (ref 40–150)
BILIRUBIN TOTAL: 0.48 mg/dL (ref 0.20–1.20)
BUN: 8.5 mg/dL (ref 7.0–26.0)
CO2: 25 meq/L (ref 22–29)
Calcium: 8.7 mg/dL (ref 8.4–10.4)
Chloride: 109 mEq/L (ref 98–109)
Creatinine: 0.8 mg/dL (ref 0.7–1.3)
GLUCOSE: 89 mg/dL (ref 70–140)
POTASSIUM: 4.1 meq/L (ref 3.5–5.1)
SODIUM: 141 meq/L (ref 136–145)
Total Protein: 6.1 g/dL — ABNORMAL LOW (ref 6.4–8.3)

## 2016-01-28 LAB — IRON AND TIBC
%SAT: 5 % — AB (ref 20–55)
IRON: 16 ug/dL — AB (ref 42–163)
TIBC: 346 ug/dL (ref 202–409)
UIBC: 330 ug/dL (ref 117–376)

## 2016-01-28 LAB — FERRITIN: FERRITIN: 23 ng/mL (ref 22–316)

## 2016-01-28 LAB — LACTATE DEHYDROGENASE: LDH: 146 U/L (ref 125–245)

## 2016-01-28 MED ORDER — IMATINIB MESYLATE 100 MG PO TABS: 400.0000 mg | ORAL_TABLET | Freq: Two times a day (BID) | ORAL | 1 refills | Status: DC

## 2016-01-28 NOTE — Telephone Encounter (Signed)
Received communication from [place]. PA needed for Gleevec dose change. Dr. Irene Limbo plans to use the 176m tablets in case patient doesn't tolerate 4063mBID (based upon an analysis of data from the [employer] (ACOSOG) Z9001 trial, for patients who harbor an exon 9 KIT mutation, which confers relative resistance to imatinib, a dose of 800 mg per day may be preferred, if tolerated.   Pt has exon 9 KIT mutation.  PA submitted through covermymeds.com.  KEY:  [id number]  Will continue to follow.  BrRaul DelPharmD, BCPS, [phone]

## 2016-01-30 ENCOUNTER — Telehealth: Payer: Self-pay | Admitting: Pharmacist

## 2016-01-30 NOTE — Telephone Encounter (Signed)
Called [facility] to check on status of PA for Gleevec. Caller informed me that no PA was necessary and Gleevec was approved for 01/09/16 - 01/08/2017 [id number]).  I then called [facility] outpatient pharmacy and the rx was still denied due to exceeded quantities.  The maximum amount of 100mg  tablets that the insurance plan will pay for is 3 x 100mg  per day. Aaron Edelman (pharmacist at [facility] outpatient pharmacy) stated that we could try to call [phone] stating that we needed a quantity override for dose titration purposes. Called this number and caller stated I needed to fill out a form. I completed the Post Limit Quantity Exceptions Prior Authorizations Request form and faxed it to [phone]. Fax confirmation received.  Will continue to follow.  Raul Del, PharmD, BCPS, [facility] [phone]

## 2016-02-02 NOTE — Progress Notes (Signed)
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  INTERVAL HISTORY:  Caleb Foley here for followup of his metastatic Gist tumor.  His molecular studies reveal a c-kit exon 9 mutation. He notes no nausea and no abdominal pain.  Reports no overt toxicity from the GlAmasa He reports that his abdominal distention feels improved and that he is able to eat better and has gained back some weight.no leg swelling.  No chest pain.  No shortness of breath.  We discussed his PET CT scan results and has monitored her studies. His labs show a drop in his hemoglobin to 7.8 though the patient has no dyspnea on exertion no fatigue and no chest pain. he reports no overt GI bleeding,no hematemesis no melena nor hematochezia. Not using NSAIDs.  REVIEW OF SYSTEMS:    10 Point review of systems of done and is negative except as noted above.  . Past Medical History:  Diagnosis Date  . Arthritis   . Chronic back pain   . G6PD deficiency anemia ([place]  . Umbilical hernia     . Past Surgical History:  Procedure Laterality Date  . HERNIA REPAIR  04/30/11   umb hernia  . LUMBAR LAMINECTOMY/DECOMPRESSION MICRODISCECTOMY  06/17/2012   Procedure: LUMBAR LAMINECTOMY/DECOMPRESSION MICRODISCECTOMY 1 LEVEL;  Surgeon: KyWinfield CunasMD;  [facility];  Service: Neurosurgery;  Laterality: Right;  RIGHT Lumbar Four-five diskectomy    . Social History  Substance Use Topics  . Smoking status: Current Every Day Smoker    Packs/day: 1.00    Years: 5.00    Types: Cigars  . Smokeless tobacco: Never Used     Comment: smoking 1-2 black n milds a day  occ alcohol weekends   . Alcohol use  Yes     Comment: "social"    ALLERGIES:  has No Known Allergies.  MEDICATIONS:  Current Outpatient Prescriptions  Medication Sig Dispense Refill  . folic acid (FOLVITE) 1 MG tablet Take 1 tablet (1 mg total) by mouth daily.    . ondansetron (ZOFRAN) 8 MG tablet Take 1 tablet (8 mg total) by mouth every 8 (eight) hours as needed for nausea or vomiting. 30 tablet 3  . oxyCODONE (OXY IR/ROXICODONE) 5 MG immediate release tablet Take 1 tablet (5 mg total) by mouth every 6 (six) hours as needed for moderate pain or severe pain. 30 tablet 0  . senna-docusate (SENNA S) 8.6-50 MG tablet Take 2 tablets by mouth at bedtime as needed for mild constipation or moderate constipation. 60 tablet 1  . imatinib (GLEEVEC) 100 MG tablet Take 4 tablets (400 mg total) by mouth 2 (two) times daily. Take with meals and large glass of water.Caution:Chemotherapy. 240 tablet 1   No current facility-administered medications for this visit.     PHYSICAL EXAMINATION: ECOG PERFORMANCE STATUS: 1 - Symptomatic but completely ambulatory  . Vitals:   01/28/16 1319  BP: 111/75  Pulse: 79  Resp: 16  Temp: 98.5 F (36.9 C)   . Wt Readings from Last 3 Encounters:  01/22/16 158 lb 6.4 oz (71.8 kg)  01/07/16 151 lb 11.2 oz (68.8 kg)  12/27/15 159 lb (72.1  kg)    GENERAL:alert, in no acute distress and comfortable SKIN: skin color, texture, turgor are normal, no rashes or significant lesions EYES: normal, conjunctiva are pink and non-injected, sclera clear OROPHARYNX:no exudate, no erythema and lips, buccal mucosa, and tongue normal  NECK: supple, no JVD, thyroid normal size, non-tender, without nodularity LYMPH:  no palpable lymphadenopathy in the cervical, axillary or inguinal LUNGS: clear to auscultation with normal respiratory effort HEART: regular rate & rhythm,  no murmurs and no lower extremity edema ABDOMEN: abd distended with palpable hard mass in the middle to lower abdomen PSYCH: alert & oriented x 3  with fluent speech NEURO: no focal motor/sensory deficits  LABORATORY DATA:   I have reviewed the data as listed  . CBC Latest Ref Rng & Units 01/28/2016 01/07/2016 12/27/2015  WBC 4.0 - 10.3 10e3/uL 4.4 5.1 5.1  Hemoglobin 13.0 - 17.1 g/dL 7.8(L) 10.7(L) 10.7(L)  Hematocrit 38.4 - 49.9 % 25.6(L) 35.3(L) 35.1(L)  Platelets 140 - 400 10e3/uL 357 378 474(H)   . CBC    Component Value Date/Time   WBC 4.4 01/28/2016 1200   WBC 5.1 12/27/2015 1027   RBC 3.36 (L) 01/28/2016 1200   RBC 4.79 12/27/2015 1027   HGB 7.8 (L) 01/28/2016 1200   HCT 25.6 (L) 01/28/2016 1200   PLT 357 01/28/2016 1200   MCV 76.2 (L) 01/28/2016 1200   MCH 23.2 (L) 01/28/2016 1200   MCH 22.3 (L) 12/27/2015 1027   MCHC 30.5 (L) 01/28/2016 1200   MCHC 30.5 (L) 12/27/2015 1027   RDW 21.9 (H) 01/28/2016 1200   LYMPHSABS 0.7 (L) 01/28/2016 1200   MONOABS 0.3 01/28/2016 1200   EOSABS 0.1 01/28/2016 1200   BASOSABS 0.0 01/28/2016 1200    . CMP Latest Ref Rng & Units 01/28/2016 01/07/2016 12/27/2015  Glucose 70 - 140 mg/dl 89 75 87  BUN 7.0 - 26.0 mg/dL 8.5 9.3 8  Creatinine 0.7 - 1.3 mg/dL 0.8 0.8 0.81  Sodium 136 - 145 mEq/L 141 141 140  Potassium 3.5 - 5.1 mEq/L 4.1 4.4 4.2  Chloride 98 - 110 mmol/L - - 105  CO2 22 - 29 mEq/L 25 28 24   Calcium 8.4 - 10.4 mg/dL 8.7 10.0 9.3  Total Protein 6.4 - 8.3 g/dL 6.1(L) 7.3 6.2  Total Bilirubin 0.20 - 1.20 mg/dL 0.48 0.40 0.8  Alkaline Phos 40 - 150 U/L 113 159(H) 130(H)  AST 5 - 34 U/L 12 15 14   ALT 0 - 55 U/L 14 15 12        RADIOGRAPHIC STUDIES: I have personally reviewed the radiological images as listed and agreed with the findings in the report. Nm Pet Image Initial (pi) Skull Base To Thigh  Result Date: 01/14/2016 CLINICAL DATA:  Initial treatment strategy for gastrointestinal stromal tumor. EXAM: NUCLEAR MEDICINE PET SKULL BASE TO THIGH TECHNIQUE: 7.3 mCi F-18 FDG was injected intravenously. Full-ring PET imaging was performed from the skull base to thigh  after the radiotracer. CT data was obtained and used for attenuation correction and anatomic localization. FASTING BLOOD GLUCOSE:  Value: 107 mg/dl COMPARISON:  CT abdomen pelvis 12/15/2015 FINDINGS: NECK No GI CHEST No hypermetabolic mediastinal or hilar nodes. No suspicious pulmonary nodules on the CT scan. ABDOMEN/PELVIS Multiple hypermetabolic hepatic metastasis are present. Large lesion occupying the near entirety of the LEFT hepatic lobe measures 15.6 x 14.7 cm with SUV max equal 14. Similar smaller lesions in the RIGHT hepatic lobe with SUV max equal 11.1. No abnormal metabolic activity associated the stomach. No  hypermetabolic lesion within the small bowel or colon. Within the pelvis, large lobular mass is present with metabolic activity. Mass measures approximately 12.5 by 6.4 cm with heterogeneous metabolic activity with SUV max equal 12.9. Difficult to separate mass from the small bowel. No hypermetabolic iliac lymph nodes. Inguinal lymph nodes, or retroperitoneal lymph nodes. SKELETON No focal hypermetabolic activity to suggest skeletal metastasis. IMPRESSION: 1. Multiple large hypermetabolic hepatic metastasis. 2. Large hypermetabolic mass within the pelvis. Mass is inseparable from the small bowel. No evidence of bowel obstruction. 3. No thoracic metastasis. Electronically Signed   By: Suzy Bouchard M.D.   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 Molecular studies show KIT exon 9 mutation  PET CT scan findings as noted above #2 severe protein calorie malnutrition - Improving.  Patient notes he is eating much better and has gained 7 pounds since his last visit. #3 neoplasm related pain - much improved.  Patient notes decreased abdominal distention and it is abdominal pain. #4 microcytic anemia -His labs suggest  significant iron deficiency likely due to ongoing blood loss from his tumor. His anemia also is partly due to anemia of chronic disease from his metastatic Gist and partly from his imatinib. Seems less likely from the imatinib since his other blood counts are quite stable with no neutropenia or thrombocytopenia. . Lab Results  Component Value Date   IRON 16 (L) 01/28/2016   TIBC 346 01/28/2016   IRONPCTSAT 5 (L) 01/28/2016   (Iron and TIBC)  Lab Results  Component Value Date   FERRITIN 23 01/28/2016   Plan -PET/CT scan and molecular study results and their significance were discussed with the patient. -stool occult blood testing -Given his iron deficiency anemia will set him up for IV feraheme 510 mg every weekly x2 doses.  Informed consent was obtained for this. -He was recommended to seek immediate attention in the emergency room for dizziness lightheadedness chest pain or overt shortness of breath. -We shall repeat his labs in 1 week with type and screen to consider PRBC transfusion if there is a further drop. -Continue imatinib 400 mg by mouth daily currently -We shall plan to increase his imatinib to 400 mg by mouth twice a day as tolerated after correction of anemia. -Continue followup with Ernestene Kiel for nutritional consultation . -Will give him a referral to [facility] to have them evaluate his scans to determine if he might eventually be a candidate for surgery if he has decent response to initial imatinib treatment.  If the goal is to get him to surgery that will lend additional importance to the need for using as close to an 800 mg daily divided dose of imatinib as possible to get maximum response. -Will likely need to closely monitor his labs and transfuse PRBCs when necessary as needed and also optimally replace IV iron -has prescription for for when necessary oxycodone for moderate to severe pain as well as senna S for bowel prophylaxis. He reports that he hasn't  needed much of his pain medication. -When necessary Zofran for nausea.  #4  microcytic anemia due to blood loss anemia and iron deficiency as well as anemia of chronic disease due to metastatic malignancy . #5 B12 deficiency  #6 Folate deficiency  Plan -IV iron replacement as noted above -Transfuse PRBCs when necessary for hemoglobin less than  7 or if symptomatic. -Continue B12 and folate replacement .  Return to care for labs in 1 week with type and screen and PRBC transfusion when necessary IV feraheme qweekyl x 2 doses Return to clinic with Dr. Irene Limbo in 2 weeks with repeat labs  I spent 30 minutes counseling the patient face to face. The total time spent in the appointment was 40 minutes and more than 50% was on counseling and direct patient cares.    Sullivan Lone MD [place] [facility] Hematology/Oncology Physician [facility]  (Office):       [phone] (Work cell):  [phone] (Fax):           [phone]

## 2016-02-04 ENCOUNTER — Ambulatory Visit (HOSPITAL_BASED_OUTPATIENT_CLINIC_OR_DEPARTMENT_OTHER): Payer: BLUE CROSS/BLUE SHIELD

## 2016-02-04 ENCOUNTER — Other Ambulatory Visit: Payer: Self-pay | Admitting: *Deleted

## 2016-02-04 VITALS — BP 121/75 | HR 92 | Temp 98.8°F | Resp 17

## 2016-02-04 DIAGNOSIS — D500 Iron deficiency anemia secondary to blood loss (chronic): Secondary | ICD-10-CM

## 2016-02-04 DIAGNOSIS — C49A3 Gastrointestinal stromal tumor of small intestine: Secondary | ICD-10-CM

## 2016-02-04 DIAGNOSIS — D509 Iron deficiency anemia, unspecified: Secondary | ICD-10-CM

## 2016-02-04 LAB — FECAL OCCULT BLOOD, GUAIAC: OCCULT BLOOD: POSITIVE

## 2016-02-04 MED ORDER — IMATINIB MESYLATE 400 MG PO TABS: 400.0000 mg | ORAL_TABLET | Freq: Two times a day (BID) | ORAL | 1 refills | Status: DC

## 2016-02-04 MED ORDER — SODIUM CHLORIDE 0.9 % IV SOLN
Freq: Once | INTRAVENOUS | Status: AC
  Administered 2016-02-04: 09:00:00 via INTRAVENOUS

## 2016-02-04 MED ORDER — IMATINIB MESYLATE 100 MG PO TABS: 400.0000 mg | ORAL_TABLET | Freq: Two times a day (BID) | ORAL | 1 refills | Status: DC

## 2016-02-04 MED ORDER — FERUMOXYTOL INJECTION 510 MG/17 ML
510.0000 mg | Freq: Once | INTRAVENOUS | Status: AC
  Administered 2016-02-04: 510 mg via INTRAVENOUS
  Filled 2016-02-04: qty 17

## 2016-02-04 NOTE — Patient Instructions (Signed)

## 2016-02-05 ENCOUNTER — Encounter: Payer: Self-pay | Admitting: Pharmacist

## 2016-02-05 NOTE — Progress Notes (Signed)
Oral Chemotherapy Pharmacist Encounter   Per insurance, unable to fill imatinib prescription with 16m tablets for dose titration purposes since increasing dose due to KIT exon 9 mutation. Discussed with MD, ok to fill imatinib prescription with 4056mtablets. [facility].  Per imatinib package insert: ok to dissolve tablets in water or apple juice. No statement about crushing or breaking the tablets tablets.  Per 2016 IMSP do not crush list: imatinib tablets should not be crushed due to poor taste, not due to exposure or PK/PD changes.  Oral Chemo Clinic will continue to follow.  JeJohny DrillingPharmD, BCPS 02/05/2016  8:30 AM Oral Chemotherapy Clinic [phone] Oral Chemotherapy Pharmacist Encounter -- update 02/06/2016 11:54 AM  Received notification for [facility] that Gleevec 40054mablet BID prescription was approved and ready for the patient to pick up. Co-pay $0. They will call patient to come get medication.  Oral Chemo Clinic will continue to follow.  JesJohny DrillingharmD, BCPS Pharmacy: [phone] Chemo Clinic: [phone]/2017 11:55 AM

## 2016-02-11 ENCOUNTER — Ambulatory Visit: Payer: BLUE CROSS/BLUE SHIELD | Admitting: Hematology

## 2016-02-11 ENCOUNTER — Ambulatory Visit: Payer: BLUE CROSS/BLUE SHIELD

## 2016-02-11 ENCOUNTER — Encounter: Payer: Self-pay | Admitting: Hematology

## 2016-02-11 ENCOUNTER — Other Ambulatory Visit (HOSPITAL_BASED_OUTPATIENT_CLINIC_OR_DEPARTMENT_OTHER): Payer: BLUE CROSS/BLUE SHIELD

## 2016-02-11 VITALS — BP 126/72 | HR 82 | Temp 98.2°F | Resp 18 | Ht 65.0 in | Wt 163.3 lb

## 2016-02-11 VITALS — BP 134/84 | HR 74

## 2016-02-11 DIAGNOSIS — C49A3 Gastrointestinal stromal tumor of small intestine: Secondary | ICD-10-CM

## 2016-02-11 DIAGNOSIS — G893 Neoplasm related pain (acute) (chronic): Secondary | ICD-10-CM | POA: Diagnosis not present

## 2016-02-11 DIAGNOSIS — D509 Iron deficiency anemia, unspecified: Secondary | ICD-10-CM

## 2016-02-11 LAB — COMPREHENSIVE METABOLIC PANEL
ALT: 12 U/L (ref 0–55)
AST: 12 U/L (ref 5–34)
Albumin: 2.7 g/dL — ABNORMAL LOW (ref 3.5–5.0)
Alkaline Phosphatase: 111 U/L (ref 40–150)
Anion Gap: 6 mEq/L (ref 3–11)
BUN: 8.3 mg/dL (ref 7.0–26.0)
CO2: 25 meq/L (ref 22–29)
Calcium: 8.3 mg/dL — ABNORMAL LOW (ref 8.4–10.4)
Chloride: 110 mEq/L — ABNORMAL HIGH (ref 98–109)
Creatinine: 0.8 mg/dL (ref 0.7–1.3)
GLUCOSE: 128 mg/dL (ref 70–140)
POTASSIUM: 3.5 meq/L (ref 3.5–5.1)
SODIUM: 141 meq/L (ref 136–145)
Total Bilirubin: 0.47 mg/dL (ref 0.20–1.20)
Total Protein: 5.9 g/dL — ABNORMAL LOW (ref 6.4–8.3)

## 2016-02-11 LAB — CBC & DIFF AND RETIC
BASO%: 0 % (ref 0.0–2.0)
Basophils Absolute: 0 10*3/uL (ref 0.0–0.1)
EOS ABS: 0.2 10*3/uL (ref 0.0–0.5)
EOS%: 4 % (ref 0.0–7.0)
HCT: 27.3 % — ABNORMAL LOW (ref 38.4–49.9)
HGB: 8.2 g/dL — ABNORMAL LOW (ref 13.0–17.1)
IMMATURE RETIC FRACT: 18.4 % — AB (ref 3.00–10.60)
LYMPH%: 12.3 % — ABNORMAL LOW (ref 14.0–49.0)
MCH: 23.4 pg — AB (ref 27.2–33.4)
MCHC: 30 g/dL — ABNORMAL LOW (ref 32.0–36.0)
MCV: 77.8 fL — AB (ref 79.3–98.0)
MONO#: 0.3 10*3/uL (ref 0.1–0.9)
MONO%: 6.5 % (ref 0.0–14.0)
NEUT%: 77.2 % — ABNORMAL HIGH (ref 39.0–75.0)
NEUTROS ABS: 3.1 10*3/uL (ref 1.5–6.5)
NRBC: 0 % (ref 0–0)
Platelets: 333 10*3/uL (ref 140–400)
RBC: 3.51 10*6/uL — AB (ref 4.20–5.82)
RDW: 21.8 % — AB (ref 11.0–14.6)
Retic %: 3.69 % — ABNORMAL HIGH (ref 0.80–1.80)
Retic Ct Abs: 129.52 10*3/uL — ABNORMAL HIGH (ref 34.80–93.90)
WBC: 4 10*3/uL (ref 4.0–10.3)
lymph#: 0.5 10*3/uL — ABNORMAL LOW (ref 0.9–3.3)

## 2016-02-11 MED ORDER — SODIUM CHLORIDE 0.9 % IV SOLN: Freq: Once | INTRAVENOUS | Status: DC

## 2016-02-11 MED ORDER — SODIUM CHLORIDE 0.9 % IV SOLN
510.0000 mg | Freq: Once | INTRAVENOUS | Status: DC
  Filled 2016-02-11: qty 17

## 2016-02-11 NOTE — Patient Instructions (Signed)

## 2016-02-13 NOTE — Progress Notes (Signed)
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  INTERVAL HISTORY:  Mr Foley here for followup of his metastatic GIST tumor and anemia. He notes no overt GI bleeding. His fecal occult blood testing in the stools was positive. He has had 1 dose of his IV Feraheme and is scheduled for other one. Hemoglobin is stable and improved to 8.2. Tolerating his imatinib well. We'll be increasing to a higher dose of imatinib today. We [facility] we shall send out a referral.   REVIEW OF SYSTEMS:    10 Point review of systems of done and is negative except as noted above.  . Past Medical History:  Diagnosis Date  . Arthritis   . Chronic back pain   . G6PD deficiency anemia ([place]  . Umbilical hernia     . Past Surgical History:  Procedure Laterality Date  . HERNIA REPAIR  04/30/11   umb hernia  . LUMBAR LAMINECTOMY/DECOMPRESSION MICRODISCECTOMY  06/17/2012   Procedure: LUMBAR LAMINECTOMY/DECOMPRESSION MICRODISCECTOMY 1 LEVEL;  Surgeon: KyWinfield CunasMD;  [facility];  Service: Neurosurgery;  Laterality: Right;  RIGHT Lumbar Four-five diskectomy    . Social History  Substance Use Topics  . Smoking status: Current Every Day Smoker    Packs/day: 1.00    Years: 5.00    Types: Cigars  . Smokeless tobacco: Never Used     Comment: smoking 1-2 black n milds a day  occ alcohol weekends   . Alcohol use Yes     Comment: "social"    ALLERGIES:  has No Known Allergies.  MEDICATIONS:  Current  Outpatient Prescriptions  Medication Sig Dispense Refill  . folic acid (FOLVITE) 1 MG tablet Take 1 tablet (1 mg total) by mouth daily.    . Caleb Foley (GLEEVEC) 400 MG tablet Take 1 tablet (400 mg total) by mouth 2 (two) times daily. Take with meals and large glass of water.Caution:Chemotherapy. 60 tablet 1  . ondansetron (ZOFRAN) 8 MG tablet Take 1 tablet (8 mg total) by mouth every 8 (eight) hours as needed for nausea or vomiting. 30 tablet 3  . oxyCODONE (OXY IR/ROXICODONE) 5 MG immediate release tablet Take 1 tablet (5 mg total) by mouth every 6 (six) hours as needed for moderate pain or severe pain. 30 tablet 0  . senna-docusate (SENNA S) 8.6-50 MG tablet Take 2 tablets by mouth at bedtime as needed for mild constipation or moderate constipation. 60 tablet 1   No current facility-administered medications for this visit.     PHYSICAL EXAMINATION: ECOG PERFORMANCE STATUS: 1 - Symptomatic but completely ambulatory  . Vitals:   02/11/16 1102  BP: 126/72  Pulse: 82  Resp: 18  Temp: 98.2 F (36.8 C)   . Wt Readings from Last 3 Encounters:  02/11/16 163 lb 4.8 oz (74.1 kg)  01/22/16 158 lb 6.4 oz (71.8 kg)  01/07/16 151 lb 11.2 oz (68.8 kg)    GENERAL:alert, in no acute distress and comfortable SKIN: skin color, texture, turgor are normal,  no rashes or significant lesions EYES: normal, conjunctiva are pink and non-injected, sclera clear OROPHARYNX:no exudate, no erythema and lips, buccal mucosa, and tongue normal  NECK: supple, no JVD, thyroid normal size, non-tender, without nodularity LYMPH:  no palpable lymphadenopathy in the cervical, axillary or inguinal LUNGS: clear to auscultation with normal respiratory effort HEART: regular rate & rhythm,  no murmurs and no lower extremity edema ABDOMEN: abd distended with palpable hard mass in the middle to lower abdomen PSYCH: alert & oriented x 3 with fluent speech NEURO: no focal motor/sensory deficits  LABORATORY DATA:   I have  reviewed the data as listed  . CBC Latest Ref Rng & Units 02/11/2016 01/28/2016 01/07/2016  WBC 4.0 - 10.3 10e3/uL 4.0 4.4 5.1  Hemoglobin 13.0 - 17.1 g/dL 8.2(L) 7.8(L) 10.7(L)  Hematocrit 38.4 - 49.9 % 27.3(L) 25.6(L) 35.3(L)  Platelets 140 - 400 10e3/uL 333 357 378   . CBC    Component Value Date/Time   WBC 4.0 02/11/2016 1010   WBC 5.1 12/27/2015 1027   RBC 3.51 (L) 02/11/2016 1010   RBC 4.79 12/27/2015 1027   HGB 8.2 (L) 02/11/2016 1010   HCT 27.3 (L) 02/11/2016 1010   PLT 333 02/11/2016 1010   MCV 77.8 (L) 02/11/2016 1010   MCH 23.4 (L) 02/11/2016 1010   MCH 22.3 (L) 12/27/2015 1027   MCHC 30.0 (L) 02/11/2016 1010   MCHC 30.5 (L) 12/27/2015 1027   RDW 21.8 (H) 02/11/2016 1010   LYMPHSABS 0.5 (L) 02/11/2016 1010   MONOABS 0.3 02/11/2016 1010   EOSABS 0.2 02/11/2016 1010   BASOSABS 0.0 02/11/2016 1010    . CMP Latest Ref Rng & Units 02/11/2016 01/28/2016 01/07/2016  Glucose 70 - 140 mg/dl 128 89 75  BUN 7.0 - 26.0 mg/dL 8.3 8.5 9.3  Creatinine 0.7 - 1.3 mg/dL 0.8 0.8 0.8  Sodium 136 - 145 mEq/L 141 141 141  Potassium 3.5 - 5.1 mEq/L 3.5 4.1 4.4  Chloride 98 - 110 mmol/L - - -  CO2 22 - 29 mEq/L 25 25 28   Calcium 8.4 - 10.4 mg/dL 8.3(L) 8.7 10.0  Total Protein 6.4 - 8.3 g/dL 5.9(L) 6.1(L) 7.3  Total Bilirubin 0.20 - 1.20 mg/dL 0.47 0.48 0.40  Alkaline Phos 40 - 150 U/L 111 113 159(H)  AST 5 - 34 U/L 12 12 15   ALT 0 - 55 U/L 12 14 15           RADIOGRAPHIC STUDIES: I have personally reviewed the radiological images as listed and agreed with the findings in the report. Nm Pet Image Initial (pi) Skull Base To Thigh  Result Date: 01/14/2016 CLINICAL DATA:  Initial treatment strategy for gastrointestinal stromal tumor. EXAM: NUCLEAR MEDICINE PET SKULL BASE TO THIGH TECHNIQUE: 7.3 mCi F-18 FDG was injected intravenously. Full-ring PET imaging was performed from the skull base to thigh after the radiotracer. CT data was obtained and used for attenuation correction and  anatomic localization. FASTING BLOOD GLUCOSE:  Value: 107 mg/dl COMPARISON:  CT abdomen pelvis 12/15/2015 FINDINGS: NECK No GI CHEST No hypermetabolic mediastinal or hilar nodes. No suspicious pulmonary nodules on the CT scan. ABDOMEN/PELVIS Multiple hypermetabolic hepatic metastasis are present. Large lesion occupying the near entirety of the LEFT hepatic lobe measures 15.6 x 14.7 cm with SUV max equal 14. Similar smaller lesions in the RIGHT hepatic lobe with SUV max equal 11.1. No abnormal metabolic activity associated the stomach. No hypermetabolic lesion within the small bowel or colon. Within the pelvis, large lobular mass is  present with metabolic activity. Mass measures approximately 12.5 by 6.4 cm with heterogeneous metabolic activity with SUV max equal 12.9. Difficult to separate mass from the small bowel. No hypermetabolic iliac lymph nodes. Inguinal lymph nodes, or retroperitoneal lymph nodes. SKELETON No focal hypermetabolic activity to suggest skeletal metastasis. IMPRESSION: 1. Multiple large hypermetabolic hepatic metastasis. 2. Large hypermetabolic mass within the pelvis. Mass is inseparable from the small bowel. No evidence of bowel obstruction. 3. No thoracic metastasis. Electronically Signed   By: Suzy Bouchard M.D.   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 Molecular studies show KIT exon 9 mutation  PET CT scan findings as noted above #2 severe protein calorie malnutrition - Improving.  He has gained 12 pounds since starting treatment and is eating well. . Wt Readings from Last 3 Encounters:  02/11/16 163 lb 4.8 oz (74.1 kg)  01/22/16 158 lb 6.4 oz (71.8 kg)  01/07/16 151 lb 11.2 oz (68.8 kg)   #3 neoplasm related pain - much improved.  Patient notes decreased abdominal distention and it is abdominal  pain. #4 microcytic anemia -His labs suggest significant iron deficiency likely due to ongoing blood loss from his tumor. FOBT x 2 positive. No overt suggestion of clinically visible bleeding. His anemia also is partly due to anemia of chronic disease from his metastatic Gist and partly from his imatinib. Seems less likely from the imatinib since his other blood counts are quite stable with no neutropenia or thrombocytopenia. . Lab Results  Component Value Date   IRON 16 (L) 01/28/2016   TIBC 346 01/28/2016   IRONPCTSAT 5 (L) 01/28/2016   (Iron and TIBC)  Lab Results  Component Value Date   FERRITIN 23 01/28/2016   Plan -We'll continue to monitor his blood counts. -Mx of  iron deficiency anemia aggressively with IV feraheme 510 mg every weekly x2 and additional if needed. --He was recommended to seek immediate attention in the emergency room for dizziness lightheadedness chest pain or overt shortness of breath or overt GI bleednig. -The patient has overt clinically significant GI bleeding need to consider possible IR consultation for angiography and possible tumor embolization or alternatively a surgical resection of this GI tumor. -we shall increase his imatinib to 400 mg by mouth PO BID  -Monitor blood counts closely. -Continue followup with Ernestene Kiel for nutritional mx - referral to [facility] to have them evaluate his scans to determine if he might eventually be a candidate for surgery if he has decent response to initial imatinib treatment.  If the goal is to get him to surgery that will lend additional importance to the need for using as close to an 800 mg daily divided dose of imatinib as possible to get maximum response. -has prescription for for when necessary oxycodone for moderate to severe pain as well as senna S for bowel prophylaxis. He reports that he hasn't needed much of his pain medication. -When necessary Zofran for nausea.  #4  microcytic anemia due to blood  loss anemia and iron deficiency as well as anemia of chronic disease due to metastatic malignancy . #5 B12 deficiency  #6 Folate deficiency  Plan -IV iron replacement as noted above -Transfuse PRBCs when necessary for hemoglobin less than 7 or if symptomatic. -Continue B12 and folate replacement .  Return to care for labs in 1  week with type and screen and PRBC transfusion when necessary IV feraheme qweekyl x 2 doses  Return to clinic with Dr. Irene Limbo in 2 weeks with repeat labs and for toxicity check your.  I spent 30 minutes counseling the patient face to face. The total time spent in the appointment was 30 minutes and more than 50% was on counseling and direct patient cares.    Sullivan Lone MD [place] [facility] Hematology/Oncology Physician [facility]  (Office):       [phone] (Work cell):  [phone] (Fax):           [phone]

## 2016-02-27 ENCOUNTER — Other Ambulatory Visit: Payer: Self-pay | Admitting: *Deleted

## 2016-02-27 ENCOUNTER — Telehealth: Payer: Self-pay | Admitting: Hematology

## 2016-02-27 ENCOUNTER — Ambulatory Visit (HOSPITAL_BASED_OUTPATIENT_CLINIC_OR_DEPARTMENT_OTHER): Payer: BLUE CROSS/BLUE SHIELD | Admitting: Hematology

## 2016-02-27 ENCOUNTER — Encounter: Payer: Self-pay | Admitting: Hematology

## 2016-02-27 ENCOUNTER — Other Ambulatory Visit (HOSPITAL_BASED_OUTPATIENT_CLINIC_OR_DEPARTMENT_OTHER): Payer: BLUE CROSS/BLUE SHIELD

## 2016-02-27 VITALS — BP 131/83 | HR 79 | Temp 98.4°F | Resp 18 | Ht 65.0 in | Wt 159.0 lb

## 2016-02-27 DIAGNOSIS — D500 Iron deficiency anemia secondary to blood loss (chronic): Secondary | ICD-10-CM

## 2016-02-27 DIAGNOSIS — G893 Neoplasm related pain (acute) (chronic): Secondary | ICD-10-CM

## 2016-02-27 DIAGNOSIS — D509 Iron deficiency anemia, unspecified: Secondary | ICD-10-CM | POA: Diagnosis not present

## 2016-02-27 DIAGNOSIS — C49A3 Gastrointestinal stromal tumor of small intestine: Secondary | ICD-10-CM

## 2016-02-27 DIAGNOSIS — C787 Secondary malignant neoplasm of liver and intrahepatic bile duct: Secondary | ICD-10-CM

## 2016-02-27 DIAGNOSIS — C49A0 Gastrointestinal stromal tumor, unspecified site: Secondary | ICD-10-CM

## 2016-02-27 LAB — CBC & DIFF AND RETIC
BASO%: 0.6 % (ref 0.0–2.0)
BASOS ABS: 0 10*3/uL (ref 0.0–0.1)
EOS%: 3.7 % (ref 0.0–7.0)
Eosinophils Absolute: 0.1 10*3/uL (ref 0.0–0.5)
HEMATOCRIT: 32.2 % — AB (ref 38.4–49.9)
HGB: 10.1 g/dL — ABNORMAL LOW (ref 13.0–17.1)
Immature Retic Fract: 2.8 % — ABNORMAL LOW (ref 3.00–10.60)
LYMPH%: 16.2 % (ref 14.0–49.0)
MCH: 25.3 pg — AB (ref 27.2–33.4)
MCHC: 31.4 g/dL — AB (ref 32.0–36.0)
MCV: 80.5 fL (ref 79.3–98.0)
MONO#: 0.3 10*3/uL (ref 0.1–0.9)
MONO%: 9 % (ref 0.0–14.0)
NEUT#: 2.3 10*3/uL (ref 1.5–6.5)
NEUT%: 70.5 % (ref 39.0–75.0)
PLATELETS: 276 10*3/uL (ref 140–400)
RBC: 4 10*6/uL — AB (ref 4.20–5.82)
RDW: 20.7 % — AB (ref 11.0–14.6)
RETIC %: 0.4 % — AB (ref 0.80–1.80)
Retic Ct Abs: 16 10*3/uL — ABNORMAL LOW (ref 34.80–93.90)
WBC: 3.2 10*3/uL — ABNORMAL LOW (ref 4.0–10.3)
lymph#: 0.5 10*3/uL — ABNORMAL LOW (ref 0.9–3.3)

## 2016-02-27 LAB — COMPREHENSIVE METABOLIC PANEL
ALT: 8 U/L (ref 0–55)
ANION GAP: 4 meq/L (ref 3–11)
AST: 12 U/L (ref 5–34)
Albumin: 2.8 g/dL — ABNORMAL LOW (ref 3.5–5.0)
Alkaline Phosphatase: 111 U/L (ref 40–150)
BUN: 7.2 mg/dL (ref 7.0–26.0)
CALCIUM: 8.4 mg/dL (ref 8.4–10.4)
CHLORIDE: 107 meq/L (ref 98–109)
CO2: 27 mEq/L (ref 22–29)
Creatinine: 0.9 mg/dL (ref 0.7–1.3)
EGFR: >90 mL/min/{1.73_m2} (ref >90)
Glucose: 97 mg/dl (ref 70–140)
POTASSIUM: 4.1 meq/L (ref 3.5–5.1)
Sodium: 138 mEq/L (ref 136–145)
Total Bilirubin: 0.61 mg/dL (ref 0.20–1.20)
Total Protein: 6 g/dL — ABNORMAL LOW (ref 6.4–8.3)

## 2016-02-27 MED ORDER — OXYCODONE HCL 5 MG PO TABS: 5.0000 mg | ORAL_TABLET | Freq: Four times a day (QID) | ORAL | 0 refills | Status: DC | PRN

## 2016-02-27 MED ORDER — IMATINIB MESYLATE 400 MG PO TABS: 400.0000 mg | ORAL_TABLET | Freq: Two times a day (BID) | ORAL | 1 refills | Status: DC

## 2016-02-27 NOTE — Telephone Encounter (Signed)
Spoke with patient re 11/10 lab/fu.

## 2016-03-02 NOTE — Progress Notes (Signed)
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  INTERVAL HISTORY:  Caleb Foley here for followup of his metastatic GIST tumor and anemia. He notes no overt GI bleeding. He is tolerating the increasing dose of Imatinib without any new concerns. Feels better after the IV iron infusions and Hgb has improved to 10. He [facility] for possible surgery. He has a f/u again for surgical consideration in Jan/Feb 2018 after a few months of treatment with Imatinib.  REVIEW OF SYSTEMS:    10 Point review of systems of done and is negative except as noted above.  . Past Medical History:  Diagnosis Date  . Arthritis   . Chronic back pain   . G6PD deficiency anemia ([place]  . Umbilical hernia     . Past Surgical History:  Procedure Laterality Date  . HERNIA REPAIR  04/30/11   umb hernia  . LUMBAR LAMINECTOMY/DECOMPRESSION MICRODISCECTOMY  06/17/2012   Procedure: LUMBAR LAMINECTOMY/DECOMPRESSION MICRODISCECTOMY 1 LEVEL;  Surgeon: KyWinfield CunasMD;  [facility];  Service: Neurosurgery;  Laterality: Right;  RIGHT Lumbar Four-five diskectomy    . Social History  Substance Use Topics  . Smoking status: Current Every Day Smoker    Packs/day: 1.00    Years: 5.00    Types: Cigars  . Smokeless tobacco: Never Used     Comment: smoking 1-2 black n milds a day  occ alcohol weekends   . Alcohol use Yes     Comment: "social"    ALLERGIES:  has No Known Allergies.  MEDICATIONS:  Current Outpatient Prescriptions  Medication Sig Dispense Refill  . folic acid (FOLVITE) 1 MG  tablet Take 1 tablet (1 mg total) by mouth daily.    . Caleb Kitchenmatinib (GLEEVEC) 400 MG tablet Take 1 tablet (400 mg total) by mouth 2 (two) times daily. Take with meals and large glass of water.Caution:Chemotherapy. 60 tablet 1  . ondansetron (ZOFRAN) 8 MG tablet Take 1 tablet (8 mg total) by mouth every 8 (eight) hours as needed for nausea or vomiting. 30 tablet 3  . oxyCODONE (OXY IR/ROXICODONE) 5 MG immediate release tablet Take 1 tablet (5 mg total) by mouth every 6 (six) hours as needed for moderate pain or severe pain. 30 tablet 0  . senna-docusate (SENNA S) 8.6-50 MG tablet Take 2 tablets by mouth at bedtime as needed for mild constipation or moderate constipation. 60 tablet 1   No current facility-administered medications for this visit.     PHYSICAL EXAMINATION: ECOG PERFORMANCE STATUS: 1 - Symptomatic but completely ambulatory  . Vitals:   02/27/16 1330  BP: 131/83  Pulse: 79  Resp: 18  Temp: 98.4 F (36.9 C)   . Wt Readings from Last 3 Encounters:  02/27/16 159 lb (72.1 kg)  02/11/16 163 lb 4.8 oz (74.1 kg)  01/22/16 158 lb 6.4 oz (71.8 kg)    GENERAL:alert, in no acute distress and comfortable SKIN: skin color, texture, turgor are normal, no rashes or significant lesions EYES: normal, conjunctiva are pink and non-injected, sclera clear OROPHARYNX:no exudate, no erythema  and lips, buccal mucosa, and tongue normal  NECK: supple, no JVD, thyroid normal size, non-tender, without nodularity LYMPH:  no palpable lymphadenopathy in the cervical, axillary or inguinal LUNGS: clear to auscultation with normal respiratory effort HEART: regular rate & rhythm,  no murmurs and no lower extremity edema ABDOMEN: abd distended with palpable hard mass in the middle to lower abdomen PSYCH: alert & oriented x 3 with fluent speech NEURO: no focal motor/sensory deficits  LABORATORY DATA:   I have reviewed the data as listed  . CBC Latest Ref Rng & Units 02/27/2016 02/11/2016 01/28/2016    WBC 4.0 - 10.3 10e3/uL 3.2(L) 4.0 4.4  Hemoglobin 13.0 - 17.1 g/dL 10.1(L) 8.2(L) 7.8(L)  Hematocrit 38.4 - 49.9 % 32.2(L) 27.3(L) 25.6(L)  Platelets 140 - 400 10e3/uL 276 333 357   . CBC    Component Value Date/Time   WBC 3.2 (L) 02/27/2016 1318   WBC 5.1 12/27/2015 1027   RBC 4.00 (L) 02/27/2016 1318   RBC 4.79 12/27/2015 1027   HGB 10.1 (L) 02/27/2016 1318   HCT 32.2 (L) 02/27/2016 1318   PLT 276 02/27/2016 1318   MCV 80.5 02/27/2016 1318   MCH 25.3 (L) 02/27/2016 1318   MCH 22.3 (L) 12/27/2015 1027   MCHC 31.4 (L) 02/27/2016 1318   MCHC 30.5 (L) 12/27/2015 1027   RDW 20.7 (H) 02/27/2016 1318   LYMPHSABS 0.5 (L) 02/27/2016 1318   MONOABS 0.3 02/27/2016 1318   EOSABS 0.1 02/27/2016 1318   BASOSABS 0.0 02/27/2016 1318    . CMP Latest Ref Rng & Units 02/27/2016 02/11/2016 01/28/2016  Glucose 70 - 140 mg/dl 97 128 89  BUN 7.0 - 26.0 mg/dL 7.2 8.3 8.5  Creatinine 0.7 - 1.3 mg/dL 0.9 0.8 0.8  Sodium 136 - 145 mEq/L 138 141 141  Potassium 3.5 - 5.1 mEq/L 4.1 3.5 4.1  Chloride 98 - 110 mmol/L - - -  CO2 22 - 29 mEq/L _0 Calcium 8.4 - 10.4 mg/dL 8.4 8.3(L) 8.7  Total Protein 6.4 - 8.3 g/dL 6.0(L) 5.9(L) 6.1(L)  Total Bilirubin 0.20 - 1.20 mg/dL 0.61 0.47 0.48  Alkaline Phos 40 - 150 U/L 111 111 113  AST 5 - 34 U/L _1 ALT 0 - 55 U/L _2 RADIOGRAPHIC STUDIES: I have personally reviewed the radiological images as listed and agreed with the findings in the report. No results found.  ASSESSMENT & PLAN:   50 year old African-American male with   #1 Newly diagnosed metastatic gastrointestinal stromal tumor likely arising from the small bowel with significant liver metastases. Molecular studies show KIT exon 9 mutation  PET CT scan findings as noted above #2 severe protein calorie malnutrition - Improving.  He has gained 12 pounds since starting treatment and is eating well. . Wt Readings from Last 3 Encounters:  02/27/16 159 lb (72.1 kg)   02/11/16 163 lb 4.8 oz (74.1 kg)  01/22/16 158 lb 6.4 oz (71.8 kg)   #3 neoplasm related pain - much improved.  Patient notes decreased abdominal distention and it is abdominal pain. #4 microcytic anemia -His labs suggested significant iron deficiency likely due to ongoing blood loss from his tumor. FOBT x 2 positive. No overt suggestion of clinically visible bleeding. Hgb has improved with IV iron replacement His anemia also is partly due to anemia of chronic disease from his metastatic Gist and partly from his imatinib. Seems less likely from the imatinib  since his other blood counts are quite stable with no neutropenia or thrombocytopenia. . Lab Results  Component Value Date   IRON 16 (L) 01/28/2016   TIBC 346 01/28/2016   IRONPCTSAT 5 (L) 01/28/2016   (Iron and TIBC)  Lab Results  Component Value Date   FERRITIN 23 01/28/2016   Plan -patient is tolerating and we shall continued matinib to 400 mg by mouth PO BID  -he has been evaluated by surgery at [facility] Dr Cristino Martes and will need followup after 3-4 months of treatment on TKI -we shall plan to rpt PET/CT in late dec/early Jan. --He was recommended to seek immediate attention in the emergency room for dizziness lightheadedness chest pain or overt shortness of breath or overt GI bleednig. -The patient has overt clinically significant GI bleeding need to consider possible IR consultation for angiography and possible tumor embolization or alternatively a surgical resection of this GI tumor. -Monitor blood counts closely. -Continue followup with Ernestene Kiel for nutritional mx -has prescription for for when necessary oxycodone for moderate to severe pain as well as senna S for bowel prophylaxis. He reports that he hasn't needed much of his pain medication. -When necessary Zofran for nausea.  #4  microcytic anemia due to blood loss anemia and iron deficiency as well as anemia of chronic disease due to metastatic malignancy . #5 B12  deficiency  #6 Folate deficiency  Plan -IV iron replacement as needed to maintain ferritin >100 -Transfuse PRBCs when necessary for hemoglobin less than 7 or if symptomatic. -Continue B12 and folate replacement .  Return to care with Dr Irene Limbo in 4 weeks with cbc, cmp , LDH and iron labs. Earlier if any new symptoms.   I spent 25 minutes counseling the patient face to face. The total time spent in the appointment was 30 minutes and more than 50% was on counseling and direct patient cares.    Sullivan Lone MD [place] AAHIVMS [facility] Hematology/Oncology Physician [facility]  (Office):       [phone] (Work cell):  [phone] (Fax):           [phone]

## 2016-03-27 ENCOUNTER — Telehealth: Payer: Self-pay | Admitting: Hematology

## 2016-03-27 ENCOUNTER — Ambulatory Visit (HOSPITAL_BASED_OUTPATIENT_CLINIC_OR_DEPARTMENT_OTHER): Payer: BLUE CROSS/BLUE SHIELD | Admitting: Hematology

## 2016-03-27 ENCOUNTER — Other Ambulatory Visit (HOSPITAL_BASED_OUTPATIENT_CLINIC_OR_DEPARTMENT_OTHER): Payer: BLUE CROSS/BLUE SHIELD

## 2016-03-27 ENCOUNTER — Encounter: Payer: Self-pay | Admitting: Hematology

## 2016-03-27 VITALS — BP 135/77 | HR 78 | Temp 98.9°F | Resp 20 | Wt 160.8 lb

## 2016-03-27 DIAGNOSIS — C787 Secondary malignant neoplasm of liver and intrahepatic bile duct: Secondary | ICD-10-CM | POA: Diagnosis not present

## 2016-03-27 DIAGNOSIS — D509 Iron deficiency anemia, unspecified: Secondary | ICD-10-CM

## 2016-03-27 DIAGNOSIS — G893 Neoplasm related pain (acute) (chronic): Secondary | ICD-10-CM | POA: Diagnosis not present

## 2016-03-27 DIAGNOSIS — C49A3 Gastrointestinal stromal tumor of small intestine: Secondary | ICD-10-CM

## 2016-03-27 DIAGNOSIS — E43 Unspecified severe protein-calorie malnutrition: Secondary | ICD-10-CM

## 2016-03-27 DIAGNOSIS — D500 Iron deficiency anemia secondary to blood loss (chronic): Secondary | ICD-10-CM

## 2016-03-27 LAB — CBC & DIFF AND RETIC
BASO%: 0.3 % (ref 0.0–2.0)
Basophils Absolute: 0 10*3/uL (ref 0.0–0.1)
EOS ABS: 0.1 10*3/uL (ref 0.0–0.5)
EOS%: 2.7 % (ref 0.0–7.0)
HCT: 32 % — ABNORMAL LOW (ref 38.4–49.9)
HEMOGLOBIN: 10.2 g/dL — AB (ref 13.0–17.1)
IMMATURE RETIC FRACT: 6.2 % (ref 3.00–10.60)
LYMPH#: 0.6 10*3/uL — AB (ref 0.9–3.3)
LYMPH%: 19 % (ref 14.0–49.0)
MCH: 26.8 pg — AB (ref 27.2–33.4)
MCHC: 31.9 g/dL — ABNORMAL LOW (ref 32.0–36.0)
MCV: 84.2 fL (ref 79.3–98.0)
MONO#: 0.2 10*3/uL (ref 0.1–0.9)
MONO%: 5 % (ref 0.0–14.0)
NEUT%: 73 % (ref 39.0–75.0)
NEUTROS ABS: 2.2 10*3/uL (ref 1.5–6.5)
Platelets: 234 10*3/uL (ref 140–400)
RBC: 3.8 10*6/uL — ABNORMAL LOW (ref 4.20–5.82)
RDW: 20.4 % — AB (ref 11.0–14.6)
RETIC %: 1.15 % (ref 0.80–1.80)
RETIC CT ABS: 43.7 10*3/uL (ref 34.80–93.90)
WBC: 3 10*3/uL — AB (ref 4.0–10.3)

## 2016-03-27 LAB — IRON AND TIBC
%SAT: 21 % (ref 20–55)
IRON: 62 ug/dL (ref 42–163)
TIBC: 293 ug/dL (ref 202–409)
UIBC: 231 ug/dL (ref 117–376)

## 2016-03-27 LAB — COMPREHENSIVE METABOLIC PANEL
ALT: 9 U/L (ref 0–55)
AST: 13 U/L (ref 5–34)
Albumin: 2.9 g/dL — ABNORMAL LOW (ref 3.5–5.0)
Alkaline Phosphatase: 103 U/L (ref 40–150)
Anion Gap: 6 mEq/L (ref 3–11)
BUN: 9.1 mg/dL (ref 7.0–26.0)
CHLORIDE: 110 meq/L — AB (ref 98–109)
CO2: 25 mEq/L (ref 22–29)
Calcium: 8.4 mg/dL (ref 8.4–10.4)
Creatinine: 0.9 mg/dL (ref 0.7–1.3)
EGFR: >90 mL/min/{1.73_m2} (ref >90)
GLUCOSE: 117 mg/dL (ref 70–140)
POTASSIUM: 3.5 meq/L (ref 3.5–5.1)
SODIUM: 141 meq/L (ref 136–145)
Total Bilirubin: 0.73 mg/dL (ref 0.20–1.20)
Total Protein: 6.2 g/dL — ABNORMAL LOW (ref 6.4–8.3)

## 2016-03-27 LAB — FERRITIN: FERRITIN: 85 ng/mL (ref 22–316)

## 2016-03-27 LAB — LACTATE DEHYDROGENASE: LDH: 209 U/L (ref 125–245)

## 2016-03-27 LAB — TECHNOLOGIST REVIEW

## 2016-03-27 MED ORDER — IMATINIB MESYLATE 400 MG PO TABS: 400.0000 mg | ORAL_TABLET | Freq: Two times a day (BID) | ORAL | 2 refills | Status: DC

## 2016-03-27 NOTE — Telephone Encounter (Signed)
Appointments scheduled per 03/27/16 los. AVS report and appointment schedule was given to patient, per 03/27/16 los. ° °

## 2016-03-29 NOTE — Progress Notes (Signed)
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  INTERVAL HISTORY:  Caleb Foley here for followup of his metastatic GIST tumor and anemia. He notes no overt GI bleeding. He is tolerating the increasing dose of Imatinib without any new concerns and notes that his appetite has been very good. He had some watery diarrhea 2-4 times a day for about 3-4 days which has now resolved. Notes no overt new abdominal pain. No mucositis or blood in the stools. His diarrhea has now resolved by itself. It is not associated with any specific food intake. No other overt new dose concerns with the higher dose of imatinib. No leg swelling.  He feels that his abdominal distention is continuing to improve  REVIEW OF SYSTEMS:    10 Point review of systems of done and is negative except as noted above.  . Past Medical History:  Diagnosis Date  . Arthritis   . Chronic back pain   . G[place]PD deficiency anemia ([place]   . Umbilical hernia     . Past Surgical History:  Procedure Laterality Date  . HERNIA REPAIR  04/30/11   umb hernia  . LUMBAR LAMINECTOMY/DECOMPRESSION MICRODISCECTOMY  06/17/2012   Procedure: LUMBAR LAMINECTOMY/DECOMPRESSION MICRODISCECTOMY 1 LEVEL;  Surgeon: KWinfield Cunas MD;  [facility];  Service: Neurosurgery;  Laterality: Right;  RIGHT Lumbar Four-five diskectomy    . Social History  Substance Use Topics  . Smoking status: Current Every Day Smoker    Packs/day: 1.00    Years: 5.00    Types: Cigars  . Smokeless tobacco: Never Used     Comment: smoking 1-2 black n milds a day  occ alcohol  weekends   . Alcohol use Yes     Comment: "social"    ALLERGIES:  has No Known Allergies.  MEDICATIONS:  Current Outpatient Prescriptions  Medication Sig Dispense Refill  . folic acid (FOLVITE) 1 MG tablet Take 1 tablet (1 mg total) by mouth daily.    .[name] Kitchenimatinib (GLEEVEC) 400 MG tablet Take 1 tablet (400 mg total) by mouth 2 (two) times daily. Take with meals and large glass of water.Caution:Chemotherapy. 60 tablet 2  . ondansetron (ZOFRAN) 8 MG tablet Take 1 tablet (8 mg total) by mouth every 8 (eight) hours as needed for nausea or vomiting. 30 tablet 3  . oxyCODONE (OXY IR/ROXICODONE) 5 MG immediate release tablet Take 1 tablet (5 mg total) by mouth every 6 (six) hours as needed for moderate pain or severe pain. 30 tablet 0  . senna-docusate (SENNA S) 8.6-50 MG tablet Take 2 tablets by mouth at bedtime as needed for mild constipation or moderate constipation. 60 tablet 1   No current facility-administered medications for this visit.     PHYSICAL EXAMINATION: ECOG PERFORMANCE STATUS: 1 - Symptomatic but completely ambulatory  . Vitals:   03/27/16 1107  BP: 135/77  Pulse: 78  Resp: 20  Temp: 98.9 F (37.2 C)   . Wt Readings from Last 3 Encounters:  03/27/16 160 lb 12.8 oz (72.9 kg)  02/27/16 159 lb (72.1 kg)  02/11/16 163 lb 4.8 oz (  74.1 kg)    GENERAL:alert, in no acute distress and comfortable SKIN: skin color, texture, turgor are normal, no rashes or significant lesions EYES: normal, conjunctiva are pink and non-injected, sclera clear OROPHARYNX:no exudate, no erythema and lips, buccal mucosa, and tongue normal  NECK: supple, no JVD, thyroid normal size, non-tender, without nodularity LYMPH:  no palpable lymphadenopathy in the cervical, axillary or inguinal LUNGS: clear to auscultation with normal respiratory effort HEART: regular rate & rhythm,  no murmurs and no lower extremity edema ABDOMEN: abd distended with palpable hard mass in the middle to lower  abdomen PSYCH: alert & oriented x 3 with fluent speech NEURO: no focal motor/sensory deficits  LABORATORY DATA:   I have reviewed the data as listed  . CBC Latest Ref Rng & Units 03/27/2016 02/27/2016 02/11/2016  WBC 4.0 - 10.3 10e3/uL 3.0(L) 3.2(L) 4.0  Hemoglobin 13.0 - 17.1 g/dL 10.2(L) 10.1(L) 8.2(L)  Hematocrit 38.4 - 49.9 % 32.0(L) 32.2(L) 27.3(L)  Platelets 140 - 400 10e3/uL 234 276 333   . CBC    Component Value Date/Time   WBC 3.0 (L) 03/27/2016 1049   WBC 5.1 12/27/2015 1027   RBC 3.80 (L) 03/27/2016 1049   RBC 4.79 12/27/2015 1027   HGB 10.2 (L) 03/27/2016 1049   HCT 32.0 (L) 03/27/2016 1049   PLT 234 03/27/2016 1049   MCV 84.2 03/27/2016 1049   MCH 26.8 (L) 03/27/2016 1049   MCH 22.3 (L) 12/27/2015 1027   MCHC 31.9 (L) 03/27/2016 1049   MCHC 30.5 (L) 12/27/2015 1027   RDW 20.4 (H) 03/27/2016 1049   LYMPHSABS 0.6 (L) 03/27/2016 1049   MONOABS 0.2 03/27/2016 1049   EOSABS 0.1 03/27/2016 1049   BASOSABS 0.0 03/27/2016 1049    . CMP Latest Ref Rng & Units 03/27/2016 02/27/2016 02/11/2016  Glucose 70 - 140 mg/dl 117 97 128  BUN 7.0 - 26.0 mg/dL 9.1 7.2 8.3  Creatinine 0.7 - 1.3 mg/dL 0.9 0.9 0.8  Sodium 136 - 145 mEq/L 141 138 141  Potassium 3.5 - 5.1 mEq/L 3.5 4.1 3.5  Chloride 98 - 110 mmol/L - - -  CO2 22 - 29 mEq/L _0 Calcium 8.4 - 10.4 mg/dL 8.4 8.4 8.3(L)  Total Protein 6.4 - 8.3 g/dL 6.2(L) 6.0(L) 5.9(L)  Total Bilirubin 0.20 - 1.20 mg/dL 0.73 0.61 0.47  Alkaline Phos 40 - 150 U/L 103 111 111  AST 5 - 34 U/L _1 ALT 0 - 55 U/L _2 RADIOGRAPHIC STUDIES: I have personally reviewed the radiological images as listed and agreed with the findings in the report. No results found.  ASSESSMENT & PLAN:   49 year old African-American male with   #1 Newly diagnosed metastatic gastrointestinal stromal tumor likely arising from the small bowel with significant liver metastases. Molecular studies show KIT exon 9 mutation    #2 severe protein calorie malnutrition - Improving.  He has gained 12 pounds since starting treatment and is eating well. Weight has stabilized. His baseline weight is about 170 lbs. . Wt Readings from Last 3 Encounters:  03/27/16 160 lb 12.8 oz (72.9 kg)  02/27/16 159 lb (72.1 kg)  02/11/16 163 lb 4.8 oz (74.1 kg)   #3 neoplasm related pain - much improved.  Patient notes decreased abdominal distention and it is abdominal pain. He notes that he is not really needing any pain medication on a regular basis. PLAN -Patient labs are stable and he  has no prohibitive toxicity from imatinib at this time. -We shall continue imatinib 400 mg by mouth twice a day. -We'll repeat labs and PET/CT scan in about 4 weeks prior to his next clinic visit to reevaluate response after 3 months of treatment. He will need to follow-up with Dr. Mariah Milling in January or February 2018 to consider candidacy for surgery if he has good response to his imatinib. -we shall discuss with Dr. Mariah Milling how long he would want the patient off TKI prior to possible surgery when the time arises.    #4 microcytic anemia - resolving. Hgb stable. Microcytosis resolved. No overt GI bleeding noted that the patient is at risk for ongoing slow GI losses. His anemia also is partly due to anemia of chronic disease from his metastatic Gist and partly from his imatinib.  . Lab Results  Component Value Date   IRON 62 03/27/2016   TIBC 293 03/27/2016   IRONPCTSAT 21 03/27/2016   (Iron and TIBC)  Lab Results  Component Value Date   FERRITIN 85 03/27/2016   Plan --He was recommended to seek immediate attention in the emergency room for dizziness lightheadedness chest pain or overt shortness of breath or overt GI bleednig. -The patient has overt clinically significant GI bleeding need to consider possible IR consultation for angiography and possible tumor embolization or alternatively a surgical resection of this GI tumor. -Monitor blood  counts closely. -We will plan to treat him with additional IV Feraheme to maintain his ferritin more than 100.  #5 B12 deficiency  #6 Folate deficiency  Plan -Continue B12 and folate replacement .  Return to care with Dr Irene Limbo in 4 weeks with cbc, cmp , LDH and iron labs and PET/CT scan. Earlier if any new symptoms.   I spent 25 minutes counseling the patient face to face. The total time spent in the appointment was 30 minutes and more than 50% was on counseling and direct patient cares.    Sullivan Lone MD [place] AAHIVMS [facility] Hematology/Oncology Physician [facility]  (Office):       [phone] (Work cell):  [phone] (Fax):           [phone]

## 2016-03-30 ENCOUNTER — Telehealth: Payer: Self-pay | Admitting: Oncology

## 2016-03-30 NOTE — Telephone Encounter (Signed)
lvm to inform pt of 11/17 appt date/time per LOS

## 2016-03-31 ENCOUNTER — Telehealth: Payer: Self-pay

## 2016-03-31 NOTE — Telephone Encounter (Signed)
FMLA papers were faxed to [place] on 03/27/16

## 2016-04-03 ENCOUNTER — Ambulatory Visit (HOSPITAL_BASED_OUTPATIENT_CLINIC_OR_DEPARTMENT_OTHER): Payer: BLUE CROSS/BLUE SHIELD

## 2016-04-03 VITALS — BP 146/82 | HR 73 | Temp 98.9°F | Resp 20

## 2016-04-03 DIAGNOSIS — D509 Iron deficiency anemia, unspecified: Secondary | ICD-10-CM | POA: Diagnosis not present

## 2016-04-03 DIAGNOSIS — D500 Iron deficiency anemia secondary to blood loss (chronic): Secondary | ICD-10-CM

## 2016-04-03 MED ORDER — SODIUM CHLORIDE 0.9 % IV SOLN
Freq: Once | INTRAVENOUS | Status: AC
  Administered 2016-04-03: 13:00:00 via INTRAVENOUS

## 2016-04-03 MED ORDER — SODIUM CHLORIDE 0.9 % IV SOLN
510.0000 mg | Freq: Once | INTRAVENOUS | Status: AC
  Administered 2016-04-03: 510 mg via INTRAVENOUS
  Filled 2016-04-03: qty 17

## 2016-04-03 NOTE — Patient Instructions (Signed)
Ferumoxytol injection What is this medicine? FERUMOXYTOL is an iron complex. Iron is used to make healthy red blood cells, which carry oxygen and nutrients throughout the body. This medicine is used to treat iron deficiency anemia in people with chronic kidney disease. COMMON BRAND NAME(S): Feraheme What should I tell my health care provider before I take this medicine? They need to know if you have any of these conditions: -anemia not caused by low iron levels -high levels of iron in the blood -magnetic resonance imaging (MRI) test scheduled -an unusual or allergic reaction to iron, other medicines, foods, dyes, or preservatives -pregnant or trying to get pregnant -breast-feeding How should I use this medicine? This medicine is for injection into a vein. It is given by a health care professional in a hospital or clinic setting. Talk to your pediatrician regarding the use of this medicine in children. Special care may be needed. What if I miss a dose? It is important not to miss your dose. Call your doctor or health care professional if you are unable to keep an appointment. What may interact with this medicine? This medicine may interact with the following medications: -other iron products What should I watch for while using this medicine? Visit your doctor or healthcare professional regularly. Tell your doctor or healthcare professional if your symptoms do not start to get better or if they get worse. You may need blood work done while you are taking this medicine. You may need to follow a special diet. Talk to your doctor. Foods that contain iron include: whole grains/cereals, dried fruits, beans, or peas, leafy green vegetables, and organ meats (liver, kidney). What side effects may I notice from receiving this medicine? Side effects that you should report to your doctor or health care professional as soon as possible: -allergic reactions like skin rash, itching or hives, swelling of the  face, lips, or tongue -breathing problems -changes in blood pressure -feeling faint or lightheaded, falls -fever or chills -flushing, sweating, or hot feelings -swelling of the ankles or feet Side effects that usually do not require medical attention (report to your doctor or health care professional if they continue or are bothersome): -diarrhea -headache -nausea, vomiting -stomach pain Where should I keep my medicine? This drug is given in a hospital or clinic and will not be stored at home.  2017 Elsevier/Gold Standard (2015-06-06 12:41:49)  

## 2016-04-10 ENCOUNTER — Ambulatory Visit (HOSPITAL_BASED_OUTPATIENT_CLINIC_OR_DEPARTMENT_OTHER): Payer: BLUE CROSS/BLUE SHIELD

## 2016-04-10 VITALS — BP 148/89 | HR 66 | Temp 97.4°F | Resp 20

## 2016-04-10 DIAGNOSIS — D500 Iron deficiency anemia secondary to blood loss (chronic): Secondary | ICD-10-CM

## 2016-04-10 DIAGNOSIS — D509 Iron deficiency anemia, unspecified: Secondary | ICD-10-CM | POA: Diagnosis not present

## 2016-04-10 MED ORDER — SODIUM CHLORIDE 0.9 % IV SOLN
Freq: Once | INTRAVENOUS | Status: AC
  Administered 2016-04-10: 13:00:00 via INTRAVENOUS

## 2016-04-10 MED ORDER — SODIUM CHLORIDE 0.9 % IV SOLN
510.0000 mg | Freq: Once | INTRAVENOUS | Status: AC
  Administered 2016-04-10: 510 mg via INTRAVENOUS
  Filled 2016-04-10: qty 17

## 2016-04-10 NOTE — Patient Instructions (Signed)
Ferumoxytol injection What is this medicine? FERUMOXYTOL is an iron complex. Iron is used to make healthy red blood cells, which carry oxygen and nutrients throughout the body. This medicine is used to treat iron deficiency anemia in people with chronic kidney disease. COMMON BRAND NAME(S): Feraheme What should I tell my health care provider before I take this medicine? They need to know if you have any of these conditions: -anemia not caused by low iron levels -high levels of iron in the blood -magnetic resonance imaging (MRI) test scheduled -an unusual or allergic reaction to iron, other medicines, foods, dyes, or preservatives -pregnant or trying to get pregnant -breast-feeding How should I use this medicine? This medicine is for injection into a vein. It is given by a health care professional in a hospital or clinic setting. Talk to your pediatrician regarding the use of this medicine in children. Special care may be needed. What if I miss a dose? It is important not to miss your dose. Call your doctor or health care professional if you are unable to keep an appointment. What may interact with this medicine? This medicine may interact with the following medications: -other iron products What should I watch for while using this medicine? Visit your doctor or healthcare professional regularly. Tell your doctor or healthcare professional if your symptoms do not start to get better or if they get worse. You may need blood work done while you are taking this medicine. You may need to follow a special diet. Talk to your doctor. Foods that contain iron include: whole grains/cereals, dried fruits, beans, or peas, leafy green vegetables, and organ meats (liver, kidney). What side effects may I notice from receiving this medicine? Side effects that you should report to your doctor or health care professional as soon as possible: -allergic reactions like skin rash, itching or hives, swelling of the  face, lips, or tongue -breathing problems -changes in blood pressure -feeling faint or lightheaded, falls -fever or chills -flushing, sweating, or hot feelings -swelling of the ankles or feet Side effects that usually do not require medical attention (report to your doctor or health care professional if they continue or are bothersome): -diarrhea -headache -nausea, vomiting -stomach pain Where should I keep my medicine? This drug is given in a hospital or clinic and will not be stored at home.  2017 Elsevier/Gold Standard (2015-06-06 12:41:49)  

## 2016-04-15 ENCOUNTER — Telehealth: Payer: Self-pay

## 2016-04-15 NOTE — Telephone Encounter (Signed)
Faxed short term disability form to [employer]

## 2016-04-27 ENCOUNTER — Encounter (HOSPITAL_COMMUNITY)
Admission: RE | Admit: 2016-04-27 | Discharge: 2016-04-27 | Disposition: A | Discharge status: Home / Self Care | Payer: BLUE CROSS/BLUE SHIELD | Source: Ambulatory Visit | Attending: Hematology | Admitting: Hematology

## 2016-04-27 DIAGNOSIS — C787 Secondary malignant neoplasm of liver and intrahepatic bile duct: Secondary | ICD-10-CM | POA: Diagnosis present

## 2016-04-27 DIAGNOSIS — C49A3 Gastrointestinal stromal tumor of small intestine: Secondary | ICD-10-CM | POA: Diagnosis not present

## 2016-04-27 LAB — GLUCOSE, CAPILLARY: Glucose-Capillary: 88 mg/dL (ref 65–99)

## 2016-04-27 MED ORDER — FLUDEOXYGLUCOSE F - 18 (FDG) INJECTION
7.9000 | Freq: Once | INTRAVENOUS | Status: AC | PRN
  Administered 2016-04-27: 7.9 via INTRAVENOUS

## 2016-04-30 ENCOUNTER — Ambulatory Visit (HOSPITAL_BASED_OUTPATIENT_CLINIC_OR_DEPARTMENT_OTHER): Payer: BLUE CROSS/BLUE SHIELD | Admitting: Hematology

## 2016-04-30 ENCOUNTER — Telehealth: Payer: Self-pay | Admitting: Hematology

## 2016-04-30 ENCOUNTER — Encounter: Payer: Self-pay | Admitting: Hematology

## 2016-04-30 ENCOUNTER — Other Ambulatory Visit (HOSPITAL_BASED_OUTPATIENT_CLINIC_OR_DEPARTMENT_OTHER): Payer: BLUE CROSS/BLUE SHIELD

## 2016-04-30 ENCOUNTER — Other Ambulatory Visit: Payer: Self-pay | Admitting: *Deleted

## 2016-04-30 VITALS — BP 126/81 | HR 71 | Temp 98.7°F | Resp 18 | Ht 65.0 in | Wt 161.5 lb

## 2016-04-30 DIAGNOSIS — E43 Unspecified severe protein-calorie malnutrition: Secondary | ICD-10-CM

## 2016-04-30 DIAGNOSIS — C49A3 Gastrointestinal stromal tumor of small intestine: Secondary | ICD-10-CM

## 2016-04-30 DIAGNOSIS — E538 Deficiency of other specified B group vitamins: Secondary | ICD-10-CM

## 2016-04-30 DIAGNOSIS — D500 Iron deficiency anemia secondary to blood loss (chronic): Secondary | ICD-10-CM

## 2016-04-30 DIAGNOSIS — C787 Secondary malignant neoplasm of liver and intrahepatic bile duct: Secondary | ICD-10-CM

## 2016-04-30 DIAGNOSIS — C49A0 Gastrointestinal stromal tumor, unspecified site: Secondary | ICD-10-CM

## 2016-04-30 DIAGNOSIS — D509 Iron deficiency anemia, unspecified: Secondary | ICD-10-CM | POA: Diagnosis not present

## 2016-04-30 DIAGNOSIS — G893 Neoplasm related pain (acute) (chronic): Secondary | ICD-10-CM

## 2016-04-30 LAB — CBC & DIFF AND RETIC
BASO%: 0 % (ref 0.0–2.0)
Basophils Absolute: 0 10*3/uL (ref 0.0–0.1)
EOS ABS: 0.1 10*3/uL (ref 0.0–0.5)
EOS%: 2.8 % (ref 0.0–7.0)
HCT: 31 % — ABNORMAL LOW (ref 38.4–49.9)
HEMOGLOBIN: 10.1 g/dL — AB (ref 13.0–17.1)
IMMATURE RETIC FRACT: 2.1 % — AB (ref 3.00–10.60)
LYMPH%: 17 % (ref 14.0–49.0)
MCH: 30 pg (ref 27.2–33.4)
MCHC: 32.6 g/dL (ref 32.0–36.0)
MCV: 92 fL (ref 79.3–98.0)
MONO#: 0.2 10*3/uL (ref 0.1–0.9)
MONO%: 7.6 % (ref 0.0–14.0)
NEUT#: 2.1 10*3/uL (ref 1.5–6.5)
NEUT%: 72.6 % (ref 39.0–75.0)
PLATELETS: 204 10*3/uL (ref 140–400)
RBC: 3.37 10*6/uL — ABNORMAL LOW (ref 4.20–5.82)
RDW: 14.9 % — ABNORMAL HIGH (ref 11.0–14.6)
Retic %: 0.78 % — ABNORMAL LOW (ref 0.80–1.80)
Retic Ct Abs: 26.29 10*3/uL — ABNORMAL LOW (ref 34.80–93.90)
WBC: 2.9 10*3/uL — ABNORMAL LOW (ref 4.0–10.3)
lymph#: 0.5 10*3/uL — ABNORMAL LOW (ref 0.9–3.3)

## 2016-04-30 LAB — COMPREHENSIVE METABOLIC PANEL
ALBUMIN: 3 g/dL — AB (ref 3.5–5.0)
ALK PHOS: 98 U/L (ref 40–150)
ALT: 9 U/L (ref 0–55)
ANION GAP: 7 meq/L (ref 3–11)
AST: 14 U/L (ref 5–34)
BILIRUBIN TOTAL: 0.68 mg/dL (ref 0.20–1.20)
BUN: 7.7 mg/dL (ref 7.0–26.0)
CALCIUM: 8.3 mg/dL — AB (ref 8.4–10.4)
CO2: 25 mEq/L (ref 22–29)
Chloride: 107 mEq/L (ref 98–109)
Creatinine: 0.9 mg/dL (ref 0.7–1.3)
Glucose: 83 mg/dl (ref 70–140)
Potassium: 3.3 mEq/L — ABNORMAL LOW (ref 3.5–5.1)
Sodium: 139 mEq/L (ref 136–145)
TOTAL PROTEIN: 6.1 g/dL — AB (ref 6.4–8.3)

## 2016-04-30 LAB — LACTATE DEHYDROGENASE: LDH: 259 U/L — AB (ref 125–245)

## 2016-04-30 MED ORDER — OXYCODONE HCL 5 MG PO TABS: 5.0000 mg | ORAL_TABLET | Freq: Four times a day (QID) | ORAL | 0 refills | Status: DC | PRN

## 2016-04-30 NOTE — Telephone Encounter (Signed)
Appointments scheduled per 12/14 LOS. Patient given AVS report and calendars with future scheduled appointments.  °

## 2016-04-30 NOTE — Patient Instructions (Signed)
Increase potassium intake with eating potassium rich foods -- Bananas, orange juice, tomato juice , coconut water, nuts.

## 2016-05-14 NOTE — Progress Notes (Signed)
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  INTERVAL HISTORY:  Caleb BAbbeyis here for followup of his metastatic GIST tumor and anemia. He notes no overt GI bleeding. He is tolerating  Imatinib without any new concerns and notes that his appetite has been very good. No further diarrhea. Feels his abdomen is less distended and he has no discomfort at this time. PET/CT shows marked metabolic response to treatment with imatinib.  REVIEW OF SYSTEMS:    10 Point review of systems of done and is negative except as noted above.  . Past Medical History:  Diagnosis Date  . Arthritis   . Chronic back pain   . [place]PD deficiency anemia ([place]   . Umbilical hernia     . Past Surgical History:  Procedure Laterality Date  . HERNIA REPAIR  04/30/11   umb hernia  . LUMBAR LAMINECTOMY/DECOMPRESSION MICRODISCECTOMY  06/17/2012   Procedure: LUMBAR LAMINECTOMY/DECOMPRESSION MICRODISCECTOMY 1 LEVEL;  Surgeon: KWinfield Cunas MD;  [place] [facility];  Service: Neurosurgery;  Laterality: Right;  RIGHT Lumbar Four-five diskectomy    . Social History  Substance Use Topics  . Smoking status: Current Every Day Smoker    Packs/day: 1.00    Years: 5.00    Types: Cigars  . Smokeless tobacco: Never Used     Comment: smoking 1-2 black n milds a day  occ alcohol weekends   . Alcohol use Yes     Comment: "social"    ALLERGIES:  has No Known Allergies.  MEDICATIONS:  Current Outpatient Prescriptions  Medication Sig Dispense Refill  . folic acid (FOLVITE) 1 MG tablet Take 1 tablet (1 mg total) by mouth daily.    .Caleb Foley imatinib (GLEEVEC) 400 MG tablet Take 1 tablet (400 mg total) by mouth 2 (two) times daily. Take with meals and large glass of water.Caution:Chemotherapy. 60 tablet 2  . ondansetron (ZOFRAN) 8 MG tablet Take 1 tablet (8 mg total) by mouth every 8 (eight) hours as needed for nausea or vomiting. 30 tablet 3  . oxyCODONE (OXY IR/ROXICODONE) 5 MG immediate release tablet Take 1 tablet (5 mg total) by mouth every 6 (six) hours as needed for moderate pain or severe pain. 30 tablet 0  . senna-docusate (SENNA S) 8.6-50 MG tablet Take 2 tablets by mouth at bedtime as needed for mild constipation or moderate constipation. 60 tablet 1   No current facility-administered medications for this visit.     PHYSICAL EXAMINATION: ECOG PERFORMANCE STATUS: 1 - Symptomatic but completely ambulatory  . Vitals:   04/30/16 1545  BP: 126/81  Pulse: 71  Resp: 18  Temp: 98.7 F (37.1 C)   . Wt Readings from Last 3 Encounters:  04/30/16 161 lb 8 oz (73.3 kg)  03/27/16 160 lb 12.8 oz (72.9 kg)  02/27/16 159 lb (72.1 kg)    GENERAL:alert, in no acute distress and comfortable SKIN: skin color, texture, turgor are normal, no rashes or significant lesions EYES: normal, conjunctiva are pink and non-injected, sclera clear OROPHARYNX:no exudate, no erythema and lips, buccal mucosa, and tongue normal  NECK: supple, no JVD,  thyroid normal size, non-tender, without nodularity LYMPH:  no palpable lymphadenopathy in the cervical, axillary or inguinal LUNGS: clear to auscultation with normal respiratory effort HEART: regular rate & rhythm,  no murmurs and no lower extremity edema ABDOMEN: abd distended with palpable hard mass in the middle to lower abdomen PSYCH: alert & oriented x 3 with fluent speech NEURO: no focal motor/sensory deficits  LABORATORY DATA:   I have reviewed the data as listed  . CBC Latest Ref Rng & Units 04/30/2016 03/27/2016 02/27/2016  WBC 4.0 - 10.3 10e3/uL 2.9(L) 3.0(L) 3.2(L)  Hemoglobin  13.0 - 17.1 g/dL 10.1(L) 10.2(L) 10.1(L)  Hematocrit 38.4 - 49.9 % 31.0(L) 32.0(L) 32.2(L)  Platelets 140 - 400 10e3/uL 204 234 276   . CBC    Component Value Date/Time   WBC 2.9 (L) 04/30/2016 1521   WBC 5.1 12/27/2015 1027   RBC 3.37 (L) 04/30/2016 1521   RBC 4.79 12/27/2015 1027   HGB 10.1 (L) 04/30/2016 1521   HCT 31.0 (L) 04/30/2016 1521   PLT 204 04/30/2016 1521   MCV 92.0 04/30/2016 1521   MCH 30.0 04/30/2016 1521   MCH 22.3 (L) 12/27/2015 1027   MCHC 32.6 04/30/2016 1521   MCHC 30.5 (L) 12/27/2015 1027   RDW 14.9 (H) 04/30/2016 1521   LYMPHSABS 0.5 (L) 04/30/2016 1521   MONOABS 0.2 04/30/2016 1521   EOSABS 0.1 04/30/2016 1521   BASOSABS 0.0 04/30/2016 1521    . CMP Latest Ref Rng & Units 04/30/2016 03/27/2016 02/27/2016  Glucose 70 - 140 mg/dl 83 117 97  BUN 7.0 - 26.0 mg/dL 7.7 9.1 7.2  Creatinine 0.7 - 1.3 mg/dL 0.9 0.9 0.9  Sodium 136 - 145 mEq/L 139 141 138  Potassium 3.5 - 5.1 mEq/L 3.3(L) 3.5 4.1  Chloride 98 - 110 mmol/L - - -  CO2 22 - 29 mEq/L 25 25 27   Calcium 8.4 - 10.4 mg/dL 8.3(L) 8.4 8.4  Total Protein 6.4 - 8.3 g/dL 6.1(L) 6.2(L) 6.0(L)  Total Bilirubin 0.20 - 1.20 mg/dL 0.68 0.73 0.61  Alkaline Phos 40 - 150 U/L 98 103 111  AST 5 - 34 U/L 14 13 12   ALT 0 - 55 U/L 9 9 8           RADIOGRAPHIC STUDIES: I have personally reviewed the radiological images as listed and agreed with the findings in the report. Nm Pet Image Restag (ps) Skull Base To Thigh  Result Date: 04/28/2016 CLINICAL DATA:  Subsequent treatment strategy for restaging GI stromal tumor of small bowel. Status post 3 months of Imatinib. EXAM: NUCLEAR MEDICINE PET SKULL BASE TO THIGH TECHNIQUE: 7.9 mCi F-18 FDG was injected intravenously. Full-ring PET imaging was performed from the skull base to thigh after the radiotracer. CT data was obtained and used for attenuation correction and anatomic localization. FASTING BLOOD GLUCOSE:  Value: 88 mg/dl COMPARISON:  01/14/2016 PET.  CT  of 12/15/2015. FINDINGS: NECK No areas of abnormal hypermetabolism.  No cervical adenopathy. CHEST No areas of abnormal hypermetabolism. Mild cardiomegaly. No pleural fluid. Distal left main and/or proximal LAD coronary artery atherosclerosis on image 62/series 4. ABDOMEN/PELVIS Marked response to therapy of hepatic metastasis. Dominant left hepatic lobe mass measures on the order of 15.5 x 13.9 cm and is primarily cystic. Relative photopenia in this area, with a max SUV of 4.9. Compare a S.U.V. max of 14 on the prior exam. index posterior right hepatic lobe lesion measures 4.2 cm and a S.U.V. max of 3.9 on image 94/series 4. Compare 5.4  cm and a S.U.V. max of 11.1 on the prior exam. The pelvic small bowel hypermetabolism is markedly improved. Equivocal residual hypermetabolism within the central pelvis could be physiologic or represent residual disease. This measures a S.U.V. max of 6.3 on image 151/series 4. On the prior, hypermetabolism in this area measured a S.U.V. max of 12.9. Residual soft tissue density in this region could represent fluid and non-opacified bowel loops but is suspicious for residual relatively non FDG avid tumor. Example at 9.8 x 6.0 cm on image 154/series 4. Small bowel mesenteric node measures 9 mm and a S.U.V. max of 3.5 on image 134/series 4. Not readily apparent on the prior. Right renal cysts. Aortic and branch vessel atherosclerosis. New small volume abdominopelvic fluid. Ventral abdominal wall laxity. SKELETON No abnormal marrow activity. No acute osseous abnormality. IMPRESSION: 1. Marked metabolic response to therapy. Decrease to resolution of hypermetabolism within liver masses. 2. Decreased to resolved hypermetabolism within small bowel loops of the upper pelvis. Suspect residual relatively non FDG avid small bowel/mesenteric mass. This is difficult to differentiate from (new) fluid and non-opacified bowel loops. 3. Mild hypermetabolism within a mildly enlarged small bowel  mesenteric node. Cannot exclude nodal metastasis. Otherwise, no new sites of disease identified. 4. Age advanced coronary artery atherosclerosis. Aortic atherosclerosis. Electronically Signed   By: Abigail Miyamoto M.D.   On: 04/28/2016 08:38    ASSESSMENT & PLAN:   50 year old African-American male with   #1 Newly diagnosed metastatic gastrointestinal stromal tumor likely arising from the small bowel with significant liver metastases. Molecular studies show KIT exon 9 mutation  PET/CT scan after about 3-4 months of treatment shows marked metabolic response to treatment and no evidence of disease progression.  #2 severe protein calorie malnutrition - Much improved.  He has gained 12 pounds since starting treatment and is eating well. Weight has stabilized and continues to improve. His baseline weight is about 170 lbs. . Wt Readings from Last 3 Encounters:  04/30/16 161 lb 8 oz (73.3 kg)  03/27/16 160 lb 12.8 oz (72.9 kg)  02/27/16 159 lb (72.1 kg)   #3 neoplasm related pain - much improved.  Patient notes decreased abdominal distention and abdominal pain. He notes that he is not really needing any pain medication on a regular basis. PLAN -Patient labs are stable and he has no prohibitive toxicity from imatinib at this time. -We shall continue imatinib 400 mg by mouth twice a day. -PET/CT findings and images were reviewed in details with the patient and he is glad with the treatment response. -He was encouraged to try to follow-up with Dr. Mariah Milling (surgery @ [facility]) in January 2018 to consider candidacy for surgery. -we shall discuss with Dr. Mariah Milling how long he would want the patient off TKI prior to possible surgery when the time arises.  #4 microcytic anemia - resolving. Hgb stable. Microcytosis resolved. No overt GI bleeding noted that the patient is at risk for ongoing slow GI losses. His anemia also is partly due to anemia of chronic disease from his metastatic Gist and partly from his  imatinib.  . Lab Results  Component Value Date   IRON 62 03/27/2016   TIBC 293 03/27/2016   IRONPCTSAT 21 03/27/2016   (Iron and TIBC)  Lab Results  Component Value Date   FERRITIN 85 03/27/2016   Plan --He was recommended to seek immediate attention in the emergency room for dizziness lightheadedness chest pain or overt shortness of breath or overt GI bleednig. -if the patient has  overt clinically significant GI bleeding need to consider possible IR consultation for angiography and possible tumor embolization or alternatively a surgical resection of this GI tumor. -Monitor blood counts closely.  #5 B12 deficiency  #6 Folate deficiency  Plan -Continue B12 and folate replacement .  Return to care with Dr Irene Limbo in 4 weeks with cbc, cmp , LDH and iron labs  Earlier if any new symptoms.   I spent 20 minutes counseling the patient face to face. The total time spent in the appointment was 25 minutes and more than 50% was on counseling and direct patient cares.    Sullivan Lone MD [place] AAHIVMS [facility] Hematology/Oncology Physician [facility]  (Office):       [phone] (Work cell):  [phone] (Fax):           [phone]

## 2016-06-01 ENCOUNTER — Telehealth: Payer: Self-pay | Admitting: Hematology

## 2016-06-01 ENCOUNTER — Other Ambulatory Visit (HOSPITAL_BASED_OUTPATIENT_CLINIC_OR_DEPARTMENT_OTHER): Payer: BLUE CROSS/BLUE SHIELD

## 2016-06-01 ENCOUNTER — Encounter: Payer: Self-pay | Admitting: Hematology

## 2016-06-01 ENCOUNTER — Ambulatory Visit (HOSPITAL_BASED_OUTPATIENT_CLINIC_OR_DEPARTMENT_OTHER): Payer: BLUE CROSS/BLUE SHIELD | Admitting: Hematology

## 2016-06-01 VITALS — BP 121/92 | HR 81 | Temp 98.6°F | Resp 18 | Ht 65.0 in | Wt 158.7 lb

## 2016-06-01 DIAGNOSIS — D509 Iron deficiency anemia, unspecified: Secondary | ICD-10-CM | POA: Diagnosis not present

## 2016-06-01 DIAGNOSIS — C787 Secondary malignant neoplasm of liver and intrahepatic bile duct: Secondary | ICD-10-CM | POA: Diagnosis not present

## 2016-06-01 DIAGNOSIS — C49A3 Gastrointestinal stromal tumor of small intestine: Secondary | ICD-10-CM

## 2016-06-01 DIAGNOSIS — D500 Iron deficiency anemia secondary to blood loss (chronic): Secondary | ICD-10-CM

## 2016-06-01 DIAGNOSIS — E43 Unspecified severe protein-calorie malnutrition: Secondary | ICD-10-CM

## 2016-06-01 DIAGNOSIS — G893 Neoplasm related pain (acute) (chronic): Secondary | ICD-10-CM

## 2016-06-01 LAB — CBC & DIFF AND RETIC
BASO%: 0.3 % (ref 0.0–2.0)
BASOS ABS: 0 10*3/uL (ref 0.0–0.1)
EOS ABS: 0.1 10*3/uL (ref 0.0–0.5)
EOS%: 1.3 % (ref 0.0–7.0)
HEMATOCRIT: 29.5 % — AB (ref 38.4–49.9)
HGB: 9.7 g/dL — ABNORMAL LOW (ref 13.0–17.1)
IMMATURE RETIC FRACT: 11 % — AB (ref 3.00–10.60)
LYMPH%: 12.5 % — AB (ref 14.0–49.0)
MCH: 30.1 pg (ref 27.2–33.4)
MCHC: 32.9 g/dL (ref 32.0–36.0)
MCV: 91.6 fL (ref 79.3–98.0)
MONO#: 0.2 10*3/uL (ref 0.1–0.9)
MONO%: 4.8 % (ref 0.0–14.0)
NEUT#: 3.1 10*3/uL (ref 1.5–6.5)
NEUT%: 81.1 % — AB (ref 39.0–75.0)
PLATELETS: 309 10*3/uL (ref 140–400)
RBC: 3.22 10*6/uL — ABNORMAL LOW (ref 4.20–5.82)
RDW: 13 % (ref 11.0–14.6)
Retic %: 1.07 % (ref 0.80–1.80)
Retic Ct Abs: 34.45 10*3/uL — ABNORMAL LOW (ref 34.80–93.90)
WBC: 3.8 10*3/uL — ABNORMAL LOW (ref 4.0–10.3)
lymph#: 0.5 10*3/uL — ABNORMAL LOW (ref 0.9–3.3)

## 2016-06-01 LAB — COMPREHENSIVE METABOLIC PANEL
ALBUMIN: 3 g/dL — AB (ref 3.5–5.0)
ALK PHOS: 90 U/L (ref 40–150)
ALT: 8 U/L (ref 0–55)
ANION GAP: 8 meq/L (ref 3–11)
AST: 12 U/L (ref 5–34)
BILIRUBIN TOTAL: 0.83 mg/dL (ref 0.20–1.20)
BUN: 7.5 mg/dL (ref 7.0–26.0)
CO2: 26 mEq/L (ref 22–29)
CREATININE: 0.9 mg/dL (ref 0.7–1.3)
Calcium: 8.6 mg/dL (ref 8.4–10.4)
Chloride: 106 mEq/L (ref 98–109)
Glucose: 149 mg/dl — ABNORMAL HIGH (ref 70–140)
Potassium: 3.3 mEq/L — ABNORMAL LOW (ref 3.5–5.1)
Sodium: 140 mEq/L (ref 136–145)
TOTAL PROTEIN: 6.4 g/dL (ref 6.4–8.3)

## 2016-06-01 LAB — IRON AND TIBC
%SAT: 12 % — ABNORMAL LOW (ref 20–55)
IRON: 28 ug/dL — AB (ref 42–163)
TIBC: 229 ug/dL (ref 202–409)
UIBC: 200 ug/dL (ref 117–376)

## 2016-06-01 LAB — LACTATE DEHYDROGENASE: LDH: 211 U/L (ref 125–245)

## 2016-06-01 LAB — FERRITIN: Ferritin: 533 ng/ml — ABNORMAL HIGH (ref 22–316)

## 2016-06-01 NOTE — Telephone Encounter (Signed)
Per patient, he will call Dr Cristino Martes to schedule a follow up appointment @ [facility]. Appointments scheduled per 06/01/16 los. Patient was given a copy of the AVS report and appointment schedule, per 06/01/16 los.

## 2016-06-08 NOTE — Progress Notes (Signed)
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  INTERVAL HISTORY:  Caleb Foley here for followup of his metastatic GIST tumor and anemia. He notes no overt GI bleeding. He is tolerating  Imatinib without any new concerns and notes that his appetite has been very good. No further diarrhea. Feels his abdomen is less distended and he has no discomfort at this time. He had a good christmas vacation and notes that he has been eating well. He was encourage to f/u with Dr ZMariah Foley soon as possible to discussion potential surgical options.  REVIEW OF SYSTEMS:    10 Point review of systems of done and is negative except as noted above.  . Past Medical History:  Diagnosis Date  . Arthritis   . Chronic back pain   . G6PD deficiency [place] [place]   . Umbilical hernia     . Past Surgical History:  Procedure Laterality Date  . HERNIA REPAIR  04/30/11   umb hernia  . LUMBAR LAMINECTOMY/DECOMPRESSION MICRODISCECTOMY  06/17/2012   Procedure: LUMBAR LAMINECTOMY/DECOMPRESSION MICRODISCECTOMY 1 LEVEL;  Surgeon: Caleb Cunas MD;  [place] [facility];  Service: Neurosurgery;  Laterality: Right;  RIGHT Lumbar Four-five diskectomy    . Social History  Substance Use Topics  . Smoking status: Current Every Day Smoker    Packs/day: 1.00    Years: 5.00    Types: Cigars  . Smokeless tobacco: Never Used     Comment: smoking 1-2 black n milds a day  occ alcohol weekends   . Alcohol use Yes     Comment: "social"    ALLERGIES:  has No Known Allergies.  MEDICATIONS:  Current Outpatient Prescriptions  Medication Sig  Dispense Refill  . folic acid (FOLVITE) 1 MG tablet Take 1 tablet (1 mg total) by mouth daily.    .Caleb Kitchenimatinib (GLEEVEC) 400 MG tablet Take 1 tablet (400 mg total) by mouth 2 (two) times daily. Take with meals and large glass of water.Caution:Chemotherapy. 60 tablet 2  . ondansetron (ZOFRAN) 8 MG tablet Take 1 tablet (8 mg total) by mouth every 8 (eight) hours as needed for nausea or vomiting. 30 tablet 3  . oxyCODONE (OXY IR/ROXICODONE) 5 MG immediate release tablet Take 1 tablet (5 mg total) by mouth every 6 (six) hours as needed for moderate pain or severe pain. 30 tablet 0  . senna-docusate (SENNA S) 8.6-50 MG tablet Take 2 tablets by mouth at bedtime as needed for mild constipation or moderate constipation. 60 tablet 1   No current facility-administered medications for this visit.     PHYSICAL EXAMINATION: ECOG PERFORMANCE STATUS: 1 - Symptomatic but completely ambulatory  . Vitals:   06/01/16 1315  BP: (!) 121/92  Pulse: 81  Resp: 18  Temp: 98.6 F (37 C)   . Wt Readings from Last 3 Encounters:  06/01/16 158 lb 11.2 oz (72 kg)  04/30/16 161 lb 8 oz (73.3 kg)  03/27/16 160 lb 12.8 oz (72.9 kg)    GENERAL:alert, in no acute distress and comfortable SKIN: skin color, texture, turgor are normal, no rashes or significant lesions  EYES: normal, conjunctiva are pink and non-injected, sclera clear OROPHARYNX:no exudate, no erythema and lips, buccal mucosa, and tongue normal  NECK: supple, no JVD, thyroid normal size, non-tender, without nodularity LYMPH:  no palpable lymphadenopathy in the cervical, axillary or inguinal LUNGS: clear to auscultation with normal respiratory effort HEART: regular rate & rhythm,  no murmurs and no lower extremity edema ABDOMEN: abd distended with palpable hard mass in the middle to lower abdomen PSYCH: alert & oriented x 3 with fluent speech NEURO: no focal motor/sensory deficits  LABORATORY DATA:   I have reviewed the data as listed  . CBC  Latest Ref Rng & Units 06/01/2016 04/30/2016 03/27/2016  WBC 4.0 - 10.3 10e3/uL 3.8(L) 2.9(L) 3.0(L)  Hemoglobin 13.0 - 17.1 g/dL 9.7(L) 10.1(L) 10.2(L)  Hematocrit 38.4 - 49.9 % 29.5(L) 31.0(L) 32.0(L)  Platelets 140 - 400 10e3/uL 309 204 234   . CBC    Component Value Date/Time   WBC 3.8 (L) 06/01/2016 1256   WBC 5.1 12/27/2015 1027   RBC 3.22 (L) 06/01/2016 1256   RBC 4.79 12/27/2015 1027   HGB 9.7 (L) 06/01/2016 1256   HCT 29.5 (L) 06/01/2016 1256   PLT 309 06/01/2016 1256   MCV 91.6 06/01/2016 1256   MCH 30.1 06/01/2016 1256   MCH 22.3 (L) 12/27/2015 1027   MCHC 32.9 06/01/2016 1256   MCHC 30.5 (L) 12/27/2015 1027   RDW 13.0 06/01/2016 1256   LYMPHSABS 0.5 (L) 06/01/2016 1256   MONOABS 0.2 06/01/2016 1256   EOSABS 0.1 06/01/2016 1256   BASOSABS 0.0 06/01/2016 1256    . CMP Latest Ref Rng & Units 06/01/2016 04/30/2016 03/27/2016  Glucose 70 - 140 mg/dl 149(H) 83 117  BUN 7.0 - 26.0 mg/dL 7.5 7.7 9.1  Creatinine 0.7 - 1.3 mg/dL 0.9 0.9 0.9  Sodium 136 - 145 mEq/L 140 139 141  Potassium 3.5 - 5.1 mEq/L 3.3(L) 3.3(L) 3.5  Chloride 98 - 110 mmol/L - - -  CO2 22 - 29 mEq/L _0 Calcium 8.4 - 10.4 mg/dL 8.6 8.3(L) 8.4  Total Protein 6.4 - 8.3 g/dL 6.4 6.1(L) 6.2(L)  Total Bilirubin 0.20 - 1.20 mg/dL 0.83 0.68 0.73  Alkaline Phos 40 - 150 U/L 90 98 103  AST 5 - 34 U/L _1 ALT 0 - 55 U/L _2 RADIOGRAPHIC STUDIES: I have personally reviewed the radiological images as listed and agreed with the findings in the report. No results found.  ASSESSMENT & PLAN:   51 year old African-American male with   #1 Metastatic gastrointestinal stromal tumor likely arising from the small bowel with significant liver metastases. Molecular studies show KIT exon 9 mutation  PET/CT scan after about 3-4 months of treatment shows marked metabolic response to treatment and no evidence of disease progression.  #2 severe protein calorie malnutrition - Much  improved.  He has gained 12 pounds since starting treatment and is eating well. Weight has stabilized and continues to improve. His baseline weight is about 170 lbs. . Wt Readings from Last 3 Encounters:  06/01/16 158 lb 11.2 oz (72 kg)  04/30/16 161 lb 8 oz (73.3 kg)  03/27/16 160 lb 12.8 oz (72.9 kg)   #3 neoplasm related pain - much improved.  Patient notes decreased abdominal distention and abdominal pain. He notes that he is not really needing any pain medication on a regular basis. PLAN -Patient labs are stable and he has no prohibitive toxicity  from imatinib at this time. -We shall continue imatinib 400 mg by mouth twice a day. --He was encouraged to try to follow-up with Dr. Mariah Milling (surgery @ [facility]) as soon as possible to consider candidacy for surgery. -we shall discuss with Dr. Mariah Milling how long he would want the patient off TKI prior to possible surgery when the time arises.  #4 microcytic anemia - resolving. Hgb stable. Microcytosis resolved. No overt GI bleeding noted that the patient is at risk for ongoing slow GI losses. His anemia also is partly due to anemia of chronic disease from his metastatic Gist and partly from his imatinib.  . Lab Results  Component Value Date   IRON 28 (L) 06/01/2016   TIBC 229 06/01/2016   IRONPCTSAT 12 (L) 06/01/2016   (Iron and TIBC)  Lab Results  Component Value Date   FERRITIN 533 (H) 06/01/2016   Plan --He was recommended to seek immediate attention in the emergency room for dizziness lightheadedness chest pain or overt shortness of breath or overt GI bleednig. -if the patient has overt clinically significant GI bleeding need to consider possible IR consultation for angiography and possible tumor embolization or alternatively a surgical resection of this GI tumor. -Monitor blood counts closely.  #5 B12 deficiency  #6 Folate deficiency  Plan -Continue B12 and folate replacement .   F/u with your surgeon Dr Caleb Foley at [place]  with Dr Irene Limbo in 4 weeks with labs   I spent 20 minutes counseling the patient face to face. The total time spent in the appointment was 25 minutes and more than 50% was on counseling and direct patient cares.    Sullivan Lone MD [place] AAHIVMS [facility] Hematology/Oncology Physician [facility]  (Office):       [phone] (Work cell):  [phone] (Fax):           [phone]

## 2016-06-11 ENCOUNTER — Encounter: Payer: Self-pay | Admitting: Hematology

## 2016-06-11 NOTE — Progress Notes (Signed)
Faxed Disability paperwork to [facility] # [phone]

## 2016-06-29 ENCOUNTER — Other Ambulatory Visit: Payer: Self-pay | Admitting: *Deleted

## 2016-06-29 ENCOUNTER — Other Ambulatory Visit (HOSPITAL_BASED_OUTPATIENT_CLINIC_OR_DEPARTMENT_OTHER): Payer: BLUE CROSS/BLUE SHIELD

## 2016-06-29 ENCOUNTER — Ambulatory Visit (HOSPITAL_BASED_OUTPATIENT_CLINIC_OR_DEPARTMENT_OTHER): Payer: BLUE CROSS/BLUE SHIELD | Admitting: Hematology

## 2016-06-29 ENCOUNTER — Encounter: Payer: Self-pay | Admitting: Hematology

## 2016-06-29 ENCOUNTER — Telehealth: Payer: Self-pay | Admitting: Hematology

## 2016-06-29 VITALS — BP 128/87 | HR 71 | Temp 98.3°F | Resp 17 | Ht 65.0 in | Wt 155.2 lb

## 2016-06-29 DIAGNOSIS — C49A0 Gastrointestinal stromal tumor, unspecified site: Secondary | ICD-10-CM

## 2016-06-29 DIAGNOSIS — G893 Neoplasm related pain (acute) (chronic): Secondary | ICD-10-CM | POA: Diagnosis not present

## 2016-06-29 DIAGNOSIS — D509 Iron deficiency anemia, unspecified: Secondary | ICD-10-CM

## 2016-06-29 DIAGNOSIS — D630 Anemia in neoplastic disease: Secondary | ICD-10-CM

## 2016-06-29 DIAGNOSIS — C787 Secondary malignant neoplasm of liver and intrahepatic bile duct: Secondary | ICD-10-CM

## 2016-06-29 DIAGNOSIS — E43 Unspecified severe protein-calorie malnutrition: Secondary | ICD-10-CM | POA: Diagnosis not present

## 2016-06-29 DIAGNOSIS — C49A3 Gastrointestinal stromal tumor of small intestine: Secondary | ICD-10-CM | POA: Diagnosis not present

## 2016-06-29 LAB — IRON AND TIBC
%SAT: 12 % — ABNORMAL LOW (ref 20–55)
Iron: 29 ug/dL — ABNORMAL LOW (ref 42–163)
TIBC: 250 ug/dL (ref 202–409)
UIBC: 221 ug/dL (ref 117–376)

## 2016-06-29 LAB — COMPREHENSIVE METABOLIC PANEL
ALT: 9 U/L (ref 0–55)
ANION GAP: 5 meq/L (ref 3–11)
AST: 11 U/L (ref 5–34)
Albumin: 3.3 g/dL — ABNORMAL LOW (ref 3.5–5.0)
Alkaline Phosphatase: 85 U/L (ref 40–150)
BILIRUBIN TOTAL: 0.71 mg/dL (ref 0.20–1.20)
BUN: 9.8 mg/dL (ref 7.0–26.0)
CALCIUM: 8.9 mg/dL (ref 8.4–10.4)
CO2: 29 mEq/L (ref 22–29)
CREATININE: 0.9 mg/dL (ref 0.7–1.3)
Chloride: 105 mEq/L (ref 98–109)
EGFR: >90 mL/min/{1.73_m2} (ref >90)
Glucose: 77 mg/dl (ref 70–140)
Potassium: 4.3 mEq/L (ref 3.5–5.1)
Sodium: 139 mEq/L (ref 136–145)
TOTAL PROTEIN: 6.7 g/dL (ref 6.4–8.3)

## 2016-06-29 LAB — CBC WITH DIFFERENTIAL/PLATELET
BASO%: 0.2 % (ref 0.0–2.0)
Basophils Absolute: 0 10*3/uL (ref 0.0–0.1)
EOS%: 1.4 % (ref 0.0–7.0)
Eosinophils Absolute: 0.1 10*3/uL (ref 0.0–0.5)
HEMATOCRIT: 28.8 % — AB (ref 38.4–49.9)
HGB: 9.2 g/dL — ABNORMAL LOW (ref 13.0–17.1)
LYMPH#: 0.5 10*3/uL — AB (ref 0.9–3.3)
LYMPH%: 12.8 % — ABNORMAL LOW (ref 14.0–49.0)
MCH: 30 pg (ref 27.2–33.4)
MCHC: 31.9 g/dL — ABNORMAL LOW (ref 32.0–36.0)
MCV: 93.8 fL (ref 79.3–98.0)
MONO#: 0.3 10*3/uL (ref 0.1–0.9)
MONO%: 7.3 % (ref 0.0–14.0)
NEUT%: 78.3 % — AB (ref 39.0–75.0)
NEUTROS ABS: 3.3 10*3/uL (ref 1.5–6.5)
PLATELETS: 303 10*3/uL (ref 140–400)
RBC: 3.07 10*6/uL — ABNORMAL LOW (ref 4.20–5.82)
RDW: 13.9 % (ref 11.0–14.6)
WBC: 4.2 10*3/uL (ref 4.0–10.3)

## 2016-06-29 LAB — FERRITIN: FERRITIN: 417 ng/mL — AB (ref 22–316)

## 2016-06-29 MED ORDER — OXYCODONE HCL 5 MG PO TABS: 5.0000 mg | ORAL_TABLET | Freq: Four times a day (QID) | ORAL | 0 refills | Status: DC | PRN

## 2016-06-29 MED ORDER — IMATINIB MESYLATE 400 MG PO TABS: 400.0000 mg | ORAL_TABLET | Freq: Two times a day (BID) | ORAL | 6 refills | Status: DC

## 2016-06-29 NOTE — Telephone Encounter (Signed)
Appointments scheduled per 2/12 LOS. Patient given AVS report and calendars with future scheduled appointments. °

## 2016-07-04 NOTE — Progress Notes (Signed)
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  INTERVAL HISTORY:  Caleb Foley here for followup of his metastatic GIST tumor and anemia. He notes no overt GI bleeding. He is tolerating  Imatinib without any new concerns and notes that his appetite has been good. [facility] They recommend continued Imatinib and re-imaging in a few months. No further diarrhea.   REVIEW OF SYSTEMS:    10 Point review of systems of done and is negative except as noted above.  . Past Medical History:  Diagnosis Date  . Arthritis   . Chronic back pain   . G6PD deficiency [place]   . Umbilical hernia     . Past Surgical History:  Procedure Laterality Date  . HERNIA REPAIR  04/30/11   umb hernia  . LUMBAR LAMINECTOMY/DECOMPRESSION MICRODISCECTOMY  06/17/2012   Procedure: LUMBAR LAMINECTOMY/DECOMPRESSION MICRODISCECTOMY 1 LEVEL;  Surgeon: KWinfield Cunas MD;  [facility];  Service: Neurosurgery;  Laterality: Right;  RIGHT Lumbar Four-five diskectomy    . Social History  Substance Use Topics  . Smoking status: Current Every Day Smoker    Packs/day: 1.00    Years: 5.00    Types: Cigars  . Smokeless tobacco: Never Used     Comment: smoking 1-2 black n milds a day  occ alcohol weekends   . Alcohol use Yes     Comment: "social"    ALLERGIES:  has No Known Allergies.  MEDICATIONS:  Current Outpatient Prescriptions  Medication Sig Dispense Refill  . folic acid (FOLVITE) 1 MG tablet Take 1 tablet (1 mg total) by mouth daily.    .Caleb Kitchenimatinib (GLEEVEC) 400 MG tablet Take 1 tablet (400 mg  total) by mouth 2 (two) times daily. Take with meals and large glass of water.Caution:Chemotherapy. 60 tablet 6  . ondansetron (ZOFRAN) 8 MG tablet Take 1 tablet (8 mg total) by mouth every 8 (eight) hours as needed for nausea or vomiting. 30 tablet 3  . senna-docusate (SENNA S) 8.6-50 MG tablet Take 2 tablets by mouth at bedtime as needed for mild constipation or moderate constipation. 60 tablet 1  . oxyCODONE (OXY IR/ROXICODONE) 5 MG immediate release tablet Take 1 tablet (5 mg total) by mouth every 6 (six) hours as needed for moderate pain or severe pain. 30 tablet 0   No current facility-administered medications for this visit.     PHYSICAL EXAMINATION: ECOG PERFORMANCE STATUS: 1 - Symptomatic but completely ambulatory  . Vitals:   06/29/16 1310 06/29/16 1312  BP: 128/87 128/87  Pulse: 71 71  Resp: 17 17  Temp: 98.3 F (36.8 C) 98.3 F (36.8 C)   . Wt Readings from Last 3 Encounters:  06/29/16 155 lb 3.2 oz (70.4 kg)  06/01/16 158 lb 11.2 oz (72 kg)  04/30/16 161 lb 8 oz (73.3 kg)    GENERAL:alert, in no acute distress and comfortable SKIN: skin color, texture, turgor are normal, no rashes or significant lesions EYES: normal, conjunctiva are pink and non-injected, sclera clear OROPHARYNX:no exudate, no erythema and lips, buccal mucosa, and  tongue normal  NECK: supple, no JVD, thyroid normal size, non-tender, without nodularity LYMPH:  no palpable lymphadenopathy in the cervical, axillary or inguinal LUNGS: clear to auscultation with normal respiratory effort HEART: regular rate & rhythm,  no murmurs and no lower extremity edema ABDOMEN: abd distended with palpable hard mass in the middle to lower abdomen PSYCH: alert & oriented x 3 with fluent speech NEURO: no focal motor/sensory deficits  LABORATORY DATA:   I have reviewed the data as listed  . CBC Latest Ref Rng & Units 06/29/2016 06/01/2016 04/30/2016  WBC 4.0 - 10.3 10e3/uL 4.2 3.8(L) 2.9(L)  Hemoglobin 13.0 -  17.1 g/dL 9.2(L) 9.7(L) 10.1(L)  Hematocrit 38.4 - 49.9 % 28.8(L) 29.5(L) 31.0(L)  Platelets 140 - 400 10e3/uL 303 309 204   . CBC    Component Value Date/Time   WBC 4.2 06/29/2016 1256   WBC 5.1 12/27/2015 1027   RBC 3.07 (L) 06/29/2016 1256   RBC 4.79 12/27/2015 1027   HGB 9.2 (L) 06/29/2016 1256   HCT 28.8 (L) 06/29/2016 1256   PLT 303 06/29/2016 1256   MCV 93.8 06/29/2016 1256   MCH 30.0 06/29/2016 1256   MCH 22.3 (L) 12/27/2015 1027   MCHC 31.9 (L) 06/29/2016 1256   MCHC 30.5 (L) 12/27/2015 1027   RDW 13.9 06/29/2016 1256   LYMPHSABS 0.5 (L) 06/29/2016 1256   MONOABS 0.3 06/29/2016 1256   EOSABS 0.1 06/29/2016 1256   BASOSABS 0.0 06/29/2016 1256    . CMP Latest Ref Rng & Units 06/29/2016 06/01/2016 04/30/2016  Glucose 70 - 140 mg/dl 77 149(H) 83  BUN 7.0 - 26.0 mg/dL 9.8 7.5 7.7  Creatinine 0.7 - 1.3 mg/dL 0.9 0.9 0.9  Sodium 136 - 145 mEq/L 139 140 139  Potassium 3.5 - 5.1 mEq/L 4.3 3.3(L) 3.3(L)  Chloride 98 - 110 mmol/L - - -  CO2 22 - 29 mEq/L 29 26 25   Calcium 8.4 - 10.4 mg/dL 8.9 8.6 8.3(L)  Total Protein 6.4 - 8.3 g/dL 6.7 6.4 6.1(L)  Total Bilirubin 0.20 - 1.20 mg/dL 0.71 0.83 0.68  Alkaline Phos 40 - 150 U/L 85 90 98  AST 5 - 34 U/L 11 12 14   ALT 0 - 55 U/L 9 8 9           RADIOGRAPHIC STUDIES: I have personally reviewed the radiological images as listed and agreed with the findings in the report. No results found.  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 Molecular studies show KIT exon 9 mutation  PET/CT scan after about 3-4 months of treatment shows marked metabolic response to treatment and no evidence of disease progression.  #2 severe protein calorie malnutrition -  Eating well and had gain 12 lbs but has lost some weight in the last couple of months.. Wt Readings from Last 3 Encounters:  06/29/16 155  lb 3.2 oz (70.4 kg)  06/01/16 158 lb 11.2 oz (72 kg)  04/30/16 161 lb 8 oz (73.3 kg)   #3 neoplasm related pain - controlled.   Patient notes decreased abdominal distention and abdominal pain. He notes that he is not really needing any pain medication on a regular basis. PLAN -Patient labs are stable and he has no prohibitive toxicity from imatinib at this time. -will need to keep an eye on the Anemia that appears to be from Imatinob and his tumor -Dr Alwyn Pea input noted. -We had  sent his imagine studies on disc to them. -they shall be following up patient to determine resectability with a re-evaluation in a couple of months. -We shall continue imatinib 400 mg by mouth twice a day.  #4 Normocytic anemia - Iron def has resolved. 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 Lab Results  Component Value Date   IRON 29 (L) 06/29/2016   TIBC 250 06/29/2016   IRONPCTSAT 12 (L) 06/29/2016   (Iron and TIBC)  Lab Results  Component Value Date   FERRITIN 417 (H) 06/29/2016   Plan --He is aware that he should  seek immediate attention in the emergency room for dizziness lightheadedness chest pain or overt shortness of breath or overt GI bleednig. -if the patient has overt clinically significant GI bleeding need to consider possible IR consultation for angiography and possible tumor embolization or alternatively a surgical resection of this GI tumor. -Monitor blood counts closely.  #5 B12 deficiency  #6 Folate deficiency  Plan -Continue B12 and folate replacement .   PET/CT in 4 weeks RTC with Dr Irene Limbo in 4 weeks with labs and PET/CT    I spent 20 minutes counseling the patient face to face. The total time spent in the appointment was 25 minutes and more than 50% was on counseling and direct patient cares.    Sullivan Lone MD [place] AAHIVMS [facility] Hematology/Oncology Physician [facility]  (Office):       [phone] (Work cell):  [phone] (Fax):            [phone]

## 2016-07-27 ENCOUNTER — Other Ambulatory Visit (HOSPITAL_BASED_OUTPATIENT_CLINIC_OR_DEPARTMENT_OTHER): Payer: BLUE CROSS/BLUE SHIELD

## 2016-07-27 DIAGNOSIS — C49A3 Gastrointestinal stromal tumor of small intestine: Secondary | ICD-10-CM

## 2016-07-27 DIAGNOSIS — C787 Secondary malignant neoplasm of liver and intrahepatic bile duct: Secondary | ICD-10-CM

## 2016-07-27 LAB — COMPREHENSIVE METABOLIC PANEL
ALBUMIN: 3.3 g/dL — AB (ref 3.5–5.0)
ALK PHOS: 106 U/L (ref 40–150)
ALT: 12 U/L (ref 0–55)
AST: 17 U/L (ref 5–34)
Anion Gap: 7 mEq/L (ref 3–11)
BILIRUBIN TOTAL: 0.66 mg/dL (ref 0.20–1.20)
BUN: 8.2 mg/dL (ref 7.0–26.0)
CO2: 28 mEq/L (ref 22–29)
CREATININE: 1 mg/dL (ref 0.7–1.3)
Calcium: 9.1 mg/dL (ref 8.4–10.4)
Chloride: 106 mEq/L (ref 98–109)
EGFR: >90 mL/min/{1.73_m2} (ref >90)
GLUCOSE: 93 mg/dL (ref 70–140)
Potassium: 3.8 mEq/L (ref 3.5–5.1)
SODIUM: 141 meq/L (ref 136–145)
TOTAL PROTEIN: 6.7 g/dL (ref 6.4–8.3)

## 2016-07-27 LAB — CBC & DIFF AND RETIC
BASO%: 0.2 % (ref 0.0–2.0)
Basophils Absolute: 0 10*3/uL (ref 0.0–0.1)
EOS ABS: 0.2 10*3/uL (ref 0.0–0.5)
EOS%: 3.7 % (ref 0.0–7.0)
HCT: 31.8 % — ABNORMAL LOW (ref 38.4–49.9)
HEMOGLOBIN: 10 g/dL — AB (ref 13.0–17.1)
IMMATURE RETIC FRACT: 9.4 % (ref 3.00–10.60)
LYMPH#: 0.6 10*3/uL — AB (ref 0.9–3.3)
LYMPH%: 15.9 % (ref 14.0–49.0)
MCH: 30.1 pg (ref 27.2–33.4)
MCHC: 31.4 g/dL — ABNORMAL LOW (ref 32.0–36.0)
MCV: 95.8 fL (ref 79.3–98.0)
MONO#: 0.3 10*3/uL (ref 0.1–0.9)
MONO%: 7.5 % (ref 0.0–14.0)
NEUT%: 72.7 % (ref 39.0–75.0)
NEUTROS ABS: 2.9 10*3/uL (ref 1.5–6.5)
PLATELETS: 264 10*3/uL (ref 140–400)
RBC: 3.32 10*6/uL — ABNORMAL LOW (ref 4.20–5.82)
RDW: 14.3 % (ref 11.0–14.6)
RETIC CT ABS: 63.74 10*3/uL (ref 34.80–93.90)
Retic %: 1.92 % — ABNORMAL HIGH (ref 0.80–1.80)
WBC: 4 10*3/uL (ref 4.0–10.3)

## 2016-07-27 LAB — LACTATE DEHYDROGENASE: LDH: 268 U/L — ABNORMAL HIGH (ref 125–245)

## 2016-07-28 ENCOUNTER — Encounter (HOSPITAL_COMMUNITY)
Admission: RE | Admit: 2016-07-28 | Discharge: 2016-07-28 | Disposition: A | Discharge status: Home / Self Care | Payer: BLUE CROSS/BLUE SHIELD | Source: Ambulatory Visit | Attending: Hematology | Admitting: Hematology

## 2016-07-28 DIAGNOSIS — C787 Secondary malignant neoplasm of liver and intrahepatic bile duct: Secondary | ICD-10-CM | POA: Diagnosis present

## 2016-07-28 DIAGNOSIS — C49A3 Gastrointestinal stromal tumor of small intestine: Secondary | ICD-10-CM | POA: Diagnosis present

## 2016-07-28 LAB — GLUCOSE, CAPILLARY: GLUCOSE-CAPILLARY: 93 mg/dL (ref 65–99)

## 2016-07-28 MED ORDER — FLUDEOXYGLUCOSE F - 18 (FDG) INJECTION
7.7000 | Freq: Once | INTRAVENOUS | Status: AC | PRN
  Administered 2016-07-28: 7.7 via INTRAVENOUS

## 2016-07-30 ENCOUNTER — Other Ambulatory Visit: Payer: Self-pay | Admitting: *Deleted

## 2016-07-30 ENCOUNTER — Ambulatory Visit (HOSPITAL_BASED_OUTPATIENT_CLINIC_OR_DEPARTMENT_OTHER): Payer: BLUE CROSS/BLUE SHIELD | Admitting: Hematology

## 2016-07-30 ENCOUNTER — Encounter: Payer: Self-pay | Admitting: Hematology

## 2016-07-30 ENCOUNTER — Telehealth: Payer: Self-pay | Admitting: Hematology

## 2016-07-30 VITALS — BP 146/80 | HR 67 | Temp 98.9°F | Resp 19 | Wt 153.8 lb

## 2016-07-30 DIAGNOSIS — C49A3 Gastrointestinal stromal tumor of small intestine: Secondary | ICD-10-CM | POA: Diagnosis not present

## 2016-07-30 DIAGNOSIS — D500 Iron deficiency anemia secondary to blood loss (chronic): Secondary | ICD-10-CM

## 2016-07-30 DIAGNOSIS — C49A0 Gastrointestinal stromal tumor, unspecified site: Secondary | ICD-10-CM

## 2016-07-30 DIAGNOSIS — E43 Unspecified severe protein-calorie malnutrition: Secondary | ICD-10-CM

## 2016-07-30 DIAGNOSIS — D630 Anemia in neoplastic disease: Secondary | ICD-10-CM

## 2016-07-30 DIAGNOSIS — C787 Secondary malignant neoplasm of liver and intrahepatic bile duct: Secondary | ICD-10-CM

## 2016-07-30 DIAGNOSIS — G893 Neoplasm related pain (acute) (chronic): Secondary | ICD-10-CM | POA: Diagnosis not present

## 2016-07-30 MED ORDER — OXYCODONE HCL 5 MG PO TABS: 5.0000 mg | ORAL_TABLET | Freq: Four times a day (QID) | ORAL | 0 refills | Status: DC | PRN

## 2016-07-30 NOTE — Telephone Encounter (Signed)
Gave patient AVS and calender per 07/30/2016 los 

## 2016-08-02 NOTE — Progress Notes (Signed)
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  INTERVAL HISTORY:  Caleb Foley is here for followup of his metastatic GIST tumor and anemia. He notes no overt GI bleeding. He is tolerating  Imatinib without any new concerns and notes that his appetite has been good. No change in bowel habits. No pedal edema. Overall feels well. PET/CT scan suggests continued metabolic response. No evidence of disease progression noted. Did lose a few pounds despite eating well.  REVIEW OF SYSTEMS:    10 Point review of systems of done and is negative except as noted above.  . Past Medical History:  Diagnosis Date  . Arthritis   . Chronic back pain   . G6PD deficiency anemia ([place])   . Umbilical hernia     . Past Surgical History:  Procedure Laterality Date  . HERNIA REPAIR  04/30/11   umb hernia  . LUMBAR LAMINECTOMY/DECOMPRESSION MICRODISCECTOMY  06/17/2012   Procedure: LUMBAR LAMINECTOMY/DECOMPRESSION MICRODISCECTOMY 1 LEVEL;  Surgeon: Winfield Cunas, MD;  Location: [place] [facility];  Service: Neurosurgery;  Laterality: Right;  RIGHT Lumbar Four-five diskectomy    . Social History  Substance Use Topics  . Smoking status: Current Every Day Smoker    Packs/day: 1.00    Years: 5.00    Types: Cigars  . Smokeless tobacco: Never Used     Comment: smoking 1-2 black n milds a day  occ alcohol weekends   . Alcohol use Yes     Comment: "social"    ALLERGIES:  has No Known Allergies.  MEDICATIONS:  Current Outpatient Prescriptions  Medication Sig Dispense Refill  . folic acid (FOLVITE) 1 MG tablet Take 1 tablet (1 mg total) by  mouth daily.    Caleb Foley imatinib (GLEEVEC) 400 MG tablet Take 1 tablet (400 mg total) by mouth 2 (two) times daily. Take with meals and large glass of water.Caution:Chemotherapy. 60 tablet 6  . ondansetron (ZOFRAN) 8 MG tablet Take 1 tablet (8 mg total) by mouth every 8 (eight) hours as needed for nausea or vomiting. 30 tablet 3  . senna-docusate (SENNA S) 8.6-50 MG tablet Take 2 tablets by mouth at bedtime as needed for mild constipation or moderate constipation. 60 tablet 1  . oxyCODONE (OXY IR/ROXICODONE) 5 MG immediate release tablet Take 1 tablet (5 mg total) by mouth every 6 (six) hours as needed for moderate pain or severe pain. 30 tablet 0   No current facility-administered medications for this visit.     PHYSICAL EXAMINATION: ECOG PERFORMANCE STATUS: 1 - Symptomatic but completely ambulatory  . Vitals:   07/30/16 0932  BP: (!) 146/80  Pulse: 67  Resp: 19  Temp: 98.9 F (37.2 C)   . Wt Readings from Last 3 Encounters:  07/30/16 153 lb 12.8 oz (69.8 kg)  06/29/16 155 lb 3.2 oz (70.4 kg)  06/01/16 158 lb 11.2 oz (72 kg)    GENERAL:alert, in no acute distress and comfortable SKIN: skin color, texture, turgor are normal, no rashes or significant lesions EYES: normal, conjunctiva are pink and non-injected, sclera clear OROPHARYNX:no exudate, no erythema and lips, buccal mucosa, and tongue normal  NECK: supple, no JVD, thyroid normal size, non-tender, without nodularity LYMPH:  no palpable lymphadenopathy in the cervical, axillary or inguinal LUNGS: clear to auscultation with normal respiratory effort HEART: regular rate & rhythm,  no murmurs and no lower extremity edema ABDOMEN: abd distended with palpable hard mass in the middle to lower abdomen PSYCH: alert & oriented x 3 with fluent speech NEURO: no focal motor/sensory deficits  LABORATORY DATA:   I have reviewed the data as listed  . CBC Latest Ref Rng & Units 07/27/2016 06/29/2016 06/01/2016  WBC 4.0 - 10.3 10e3/uL 4.0  4.2 3.8(L)  Hemoglobin 13.0 - 17.1 g/dL 10.0(L) 9.2(L) 9.7(L)  Hematocrit 38.4 - 49.9 % 31.8(L) 28.8(L) 29.5(L)  Platelets 140 - 400 10e3/uL 264 303 309   . CBC    Component Value Date/Time   WBC 4.0 07/27/2016 0919   WBC 5.1 12/27/2015 1027   RBC 3.32 (L) 07/27/2016 0919   RBC 4.79 12/27/2015 1027   HGB 10.0 (L) 07/27/2016 0919   HCT 31.8 (L) 07/27/2016 0919   PLT 264 07/27/2016 0919   MCV 95.8 07/27/2016 0919   MCH 30.1 07/27/2016 0919   MCH 22.3 (L) 12/27/2015 1027   MCHC 31.4 (L) 07/27/2016 0919   MCHC 30.5 (L) 12/27/2015 1027   RDW 14.3 07/27/2016 0919   LYMPHSABS 0.6 (L) 07/27/2016 0919   MONOABS 0.3 07/27/2016 0919   EOSABS 0.2 07/27/2016 0919   BASOSABS 0.0 07/27/2016 0919    . CMP Latest Ref Rng & Units 07/27/2016 06/29/2016 06/01/2016  Glucose 70 - 140 mg/dl 93 77 149(H)  BUN 7.0 - 26.0 mg/dL 8.2 9.8 7.5  Creatinine 0.7 - 1.3 mg/dL 1.0 0.9 0.9  Sodium 136 - 145 mEq/L 141 139 140  Potassium 3.5 - 5.1 mEq/L 3.8 4.3 3.3(L)  Chloride 98 - 110 mmol/L - - -  CO2 22 - 29 mEq/L '28 29 26  '$ Calcium 8.4 - 10.4 mg/dL 9.1 8.9 8.6  Total Protein 6.4 - 8.3 g/dL 6.7 6.7 6.4  Total Bilirubin 0.20 - 1.20 mg/dL 0.66 0.71 0.83  Alkaline Phos 40 - 150 U/L 106 85 90  AST 5 - 34 U/L '17 11 12  '$ ALT 0 - 55 U/L '12 9 8          '$ RADIOGRAPHIC STUDIES: I have personally reviewed the radiological images as listed and agreed with the findings in the report. Nm Pet Image Restag (ps) Skull Base To Thigh  Result Date: 07/28/2016 CLINICAL DATA:  Subsequent treatment strategy for GI stromal tumor of the small bowel with liver metastatic disease. EXAM: NUCLEAR MEDICINE PET SKULL BASE TO THIGH TECHNIQUE: 7.7 mCi F-18 FDG was injected intravenously. Full-ring PET imaging was performed from the skull base to thigh after the radiotracer. CT data was obtained and used for attenuation correction and anatomic localization. FASTING BLOOD GLUCOSE:  Value: 93 mg/dl COMPARISON:  04/27/2016 FINDINGS: NECK  No hypermetabolic lymph nodes in the neck. CHEST No hypermetabolic mediastinal or hilar nodes. No suspicious pulmonary nodules on the CT data. Mild cardiomegaly. ABDOMEN/PELVIS Complex cystic lesions throughout the liver with only low-grade metabolic activity similar or less than that in the liver, maximum SUV 2.5 in these complex cystic regions. The dominant left hepatic lobe cystic lesion measures proximally 11.9 by 17.4 cm on image 102/4 (formerly 13.0 by 17.3 cm by my measurement). No significant hypermetabolic parenchymal activity in the liver. No hypermetabolic activity is observed in the pancreas, spleen, or adrenal glands. Several right renal cysts are present. Physiologic activity is  observed in bowel. No hypermetabolic mesenteric mass is observed. There stranding and edema in the omentum and mesentery. Small amount of anterior pelvic ascites. Small bilateral inguinal lymph nodes are not hypermetabolic. SKELETON Postoperative findings in the lumbar spine on the right at L4-5. IMPRESSION: 1. Complex cystic lesions in the liver, with some faint metabolic activity within some of the larger complex regions similar to background hepatic parenchyma with maximum associated SUV around 2.5. None of the lesions have hypermetabolic activity beyond that of the adjacent liver. 2. Area of possible mesenteric/omental disease shown in the central pelvis on the prior exam is inseparable from bowel on today' s exam and activity in this region is most compatible with physiologic bowel activity. There certainly is residual omental and mesenteric edema along with some anterior ascites in the pelvis. Electronically Signed   By: Van Clines M.D.   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 Molecular studies show KIT exon 9 mutation  PET/CT scan after about  3-4 months of treatment shows marked metabolic response to treatment and no evidence of disease progression.  Recent PET/CT scan on 07/28/2016 shows continued metabolic response with no evidence of disease progression.  #2 severe protein calorie malnutrition -  Eating well and had gain 12 lbs but has lost some weight in the last couple of months.. Wt Readings from Last 3 Encounters:  07/30/16 153 lb 12.8 oz (69.8 kg)  06/29/16 155 lb 3.2 oz (70.4 kg)  06/01/16 158 lb 11.2 oz (72 kg)   #3 neoplasm related pain - controlled.   Patient notes decreased abdominal distention and abdominal pain. He notes that he is not really needing any pain medication on a regular basis. PLAN -Patient labs are stable and he has no prohibitive toxicity from imatinib at this time. -We shall continue imatinib 400 mg by mouth twice a day. -Recheck thyroid function tests on next visit to monitor imatinib effect especially in the setting of some weight loss. -He has follow-up at [facility] with repeat imaging in May 2018 to determine surgical resectability with Dr Cristino Martes.  #4 Normocytic anemia - Iron def has resolved. 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 Lab Results  Component Value Date   IRON 29 (L) 06/29/2016   TIBC 250 06/29/2016   IRONPCTSAT 12 (L) 06/29/2016   (Iron and TIBC)  Lab Results  Component Value Date   FERRITIN 417 (H) 06/29/2016   Plan --He is aware that he should  seek immediate attention in the emergency room for dizziness lightheadedness chest pain or overt shortness of breath or overt GI bleednig. -if the patient has overt clinically significant GI bleeding need to consider possible IR consultation for angiography and possible tumor embolization or alternatively a surgical resection of this GI tumor. -Monitor blood counts closely.  #5 B12 deficiency  #6 Folate deficiency  Plan -Continue B12 and folate replacement .  Return to clinic with Dr. Irene Limbo in 5-6  weeks with repeat labs  I spent 20 minutes counseling the patient face to face. The total time spent in the appointment was 25 minutes and more than 50% was on counseling and direct patient cares.    Sullivan Lone MD [place] AAHIVMS [facility] Hematology/Oncology Physician [facility]  (Office):       [phone] (Work cell):  [phone] (Fax):  [phone]

## 2016-09-03 ENCOUNTER — Encounter: Payer: Self-pay | Admitting: Hematology

## 2016-09-03 ENCOUNTER — Ambulatory Visit (HOSPITAL_BASED_OUTPATIENT_CLINIC_OR_DEPARTMENT_OTHER): Payer: BLUE CROSS/BLUE SHIELD | Admitting: Hematology

## 2016-09-03 ENCOUNTER — Other Ambulatory Visit (HOSPITAL_BASED_OUTPATIENT_CLINIC_OR_DEPARTMENT_OTHER): Payer: BLUE CROSS/BLUE SHIELD

## 2016-09-03 ENCOUNTER — Telehealth: Payer: Self-pay | Admitting: Hematology

## 2016-09-03 ENCOUNTER — Other Ambulatory Visit: Payer: Self-pay | Admitting: *Deleted

## 2016-09-03 VITALS — BP 157/86 | HR 74 | Temp 99.3°F | Resp 18 | Wt 154.5 lb

## 2016-09-03 DIAGNOSIS — D649 Anemia, unspecified: Secondary | ICD-10-CM

## 2016-09-03 DIAGNOSIS — G893 Neoplasm related pain (acute) (chronic): Secondary | ICD-10-CM

## 2016-09-03 DIAGNOSIS — C49A0 Gastrointestinal stromal tumor, unspecified site: Secondary | ICD-10-CM

## 2016-09-03 DIAGNOSIS — C49A3 Gastrointestinal stromal tumor of small intestine: Secondary | ICD-10-CM

## 2016-09-03 DIAGNOSIS — E538 Deficiency of other specified B group vitamins: Secondary | ICD-10-CM | POA: Diagnosis not present

## 2016-09-03 DIAGNOSIS — C787 Secondary malignant neoplasm of liver and intrahepatic bile duct: Secondary | ICD-10-CM

## 2016-09-03 LAB — COMPREHENSIVE METABOLIC PANEL
ALT: 15 U/L (ref 0–55)
AST: 20 U/L (ref 5–34)
Albumin: 3.5 g/dL (ref 3.5–5.0)
Alkaline Phosphatase: 109 U/L (ref 40–150)
Anion Gap: 8 mEq/L (ref 3–11)
BILIRUBIN TOTAL: 0.58 mg/dL (ref 0.20–1.20)
BUN: 7.4 mg/dL (ref 7.0–26.0)
CO2: 25 meq/L (ref 22–29)
Calcium: 8.9 mg/dL (ref 8.4–10.4)
Chloride: 107 mEq/L (ref 98–109)
Creatinine: 0.9 mg/dL (ref 0.7–1.3)
GLUCOSE: 104 mg/dL (ref 70–140)
Potassium: 4.1 mEq/L (ref 3.5–5.1)
SODIUM: 141 meq/L (ref 136–145)
TOTAL PROTEIN: 6.8 g/dL (ref 6.4–8.3)

## 2016-09-03 LAB — CBC & DIFF AND RETIC
BASO%: 0 % (ref 0.0–2.0)
BASOS ABS: 0 10*3/uL (ref 0.0–0.1)
EOS ABS: 0.1 10*3/uL (ref 0.0–0.5)
EOS%: 1.2 % (ref 0.0–7.0)
HEMATOCRIT: 30.2 % — AB (ref 38.4–49.9)
HEMOGLOBIN: 9.8 g/dL — AB (ref 13.0–17.1)
IMMATURE RETIC FRACT: 6.4 % (ref 3.00–10.60)
LYMPH%: 7.6 % — AB (ref 14.0–49.0)
MCH: 30.4 pg (ref 27.2–33.4)
MCHC: 32.5 g/dL (ref 32.0–36.0)
MCV: 93.8 fL (ref 79.3–98.0)
MONO#: 0.3 10*3/uL (ref 0.1–0.9)
MONO%: 6.6 % (ref 0.0–14.0)
NEUT#: 4.2 10*3/uL (ref 1.5–6.5)
NEUT%: 84.6 % — ABNORMAL HIGH (ref 39.0–75.0)
Platelets: 241 10*3/uL (ref 140–400)
RBC: 3.22 10*6/uL — ABNORMAL LOW (ref 4.20–5.82)
RDW: 14.1 % (ref 11.0–14.6)
Retic %: 1.29 % (ref 0.80–1.80)
Retic Ct Abs: 41.54 10*3/uL (ref 34.80–93.90)
WBC: 5 10*3/uL (ref 4.0–10.3)
lymph#: 0.4 10*3/uL — ABNORMAL LOW (ref 0.9–3.3)

## 2016-09-03 LAB — LACTATE DEHYDROGENASE: LDH: 274 U/L — ABNORMAL HIGH (ref 125–245)

## 2016-09-03 LAB — TSH: TSH: 0.582 m(IU)/L (ref 0.320–4.118)

## 2016-09-03 MED ORDER — OXYCODONE HCL 5 MG PO TABS: 5.0000 mg | ORAL_TABLET | Freq: Four times a day (QID) | ORAL | 0 refills | Status: DC | PRN

## 2016-09-03 NOTE — Telephone Encounter (Signed)
Gave patient AVS and calender per 4/19 los.  

## 2016-09-04 LAB — T4, FREE: T4,Free(Direct): 1.51 ng/dL (ref 0.82–1.77)

## 2016-09-04 LAB — T4: T4 TOTAL: 10.2 ug/dL (ref 4.5–12.0)

## 2016-09-04 LAB — T3 UPTAKE
Free Thyroxine Index: 2.9 (ref 1.2–4.9)
T3 UPTAKE RATIO: 28 % (ref 24–39)

## 2016-09-07 NOTE — Progress Notes (Signed)
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  INTERVAL HISTORY:  Caleb Foley here for followup of his metastatic GIST tumor and anemia. He is tolerating  Imatinib without any new concerns and notes that his appetite has been good. Notes little bit of blood on the tissue paper but no blood in his stools suggesting superficial possibly hemorrhoidal bleeding. His hemoglobin has remained stable. No change in bowel habits. Overall feels well. [facility] along with a follow-up with Dr. SCristino MartesMD to determine if he is a surgical candidate. Weight has remained relatively stable.  REVIEW OF SYSTEMS:    10 Point review of systems of done and is negative except as noted above.  . Past Medical History:  Diagnosis Date  . Arthritis   . Chronic back pain   . G6PD deficiency [place] ([place]   . Umbilical hernia     . Past Surgical History:  Procedure Laterality Date  . HERNIA REPAIR  04/30/11   umb hernia  . LUMBAR LAMINECTOMY/DECOMPRESSION MICRODISCECTOMY  06/17/2012   Procedure: LUMBAR LAMINECTOMY/DECOMPRESSION MICRODISCECTOMY 1 LEVEL;  Surgeon: KWinfield Cunas MD;  [place];  Service: Neurosurgery;  Laterality: Right;  RIGHT Lumbar Four-five diskectomy    . Social History  Substance Use Topics  . Smoking status: Current Every Day Smoker    Packs/day: 1.00    Years: 5.00    Types: Cigars  . Smokeless tobacco: Never Used     Comment: smoking 1-2 black n milds a day  occ alcohol weekends   . Alcohol use Yes     Comment: "social"     ALLERGIES:  has No Known Allergies.  MEDICATIONS:  Current Outpatient Prescriptions  Medication Sig Dispense Refill  . folic acid (FOLVITE) 1 MG tablet Take 1 tablet (1 mg total) by mouth daily.    .[name] Kitchenimatinib (GLEEVEC) 400 MG tablet Take 1 tablet (400 mg total) by mouth 2 (two) times daily. Take with meals and large glass of water.Caution:Chemotherapy. 60 tablet 6  . ondansetron (ZOFRAN) 8 MG tablet Take 1 tablet (8 mg total) by mouth every 8 (eight) hours as needed for nausea or vomiting. 30 tablet 3  . oxyCODONE (OXY IR/ROXICODONE) 5 MG immediate release tablet Take 1 tablet (5 mg total) by mouth every 6 (six) hours as needed for moderate pain or severe pain. 30 tablet 0  . senna-docusate (SENNA S) 8.6-50 MG tablet Take 2 tablets by mouth at bedtime as needed for mild constipation or moderate constipation. 60 tablet 1   No current facility-administered medications for this visit.     PHYSICAL EXAMINATION: ECOG PERFORMANCE STATUS: 1 - Symptomatic but completely ambulatory  . Vitals:   09/03/16 0949  BP: (!) 157/86  Pulse: 74  Resp: 18  Temp: 99.3 F (37.4 C)   . Wt Readings from Last 3 Encounters:  09/03/16 154 lb 8 oz (70.1 kg)  07/30/16 153 lb 12.8 oz (69.8 kg)  06/29/16 155 lb 3.2 oz (70.4 kg)    GENERAL:alert, in no acute distress and comfortable SKIN: skin  color, texture, turgor are normal, no rashes or significant lesions EYES: normal, conjunctiva are pink and non-injected, sclera clear OROPHARYNX:no exudate, no erythema and lips, buccal mucosa, and tongue normal  NECK: supple, no JVD, thyroid normal size, non-tender, without nodularity LYMPH:  no palpable lymphadenopathy in the cervical, axillary or inguinal LUNGS: clear to auscultation with normal respiratory effort HEART: regular rate & rhythm,  no murmurs and no lower extremity edema ABDOMEN: abd distended with palpable hard mass in the middle to lower abdomen PSYCH: alert & oriented x 3 with fluent  speech NEURO: no focal motor/sensory deficits  LABORATORY DATA:   I have reviewed the data as listed  . CBC Latest Ref Rng & Units 09/03/2016 07/27/2016 06/29/2016  WBC 4.0 - 10.3 10e3/uL 5.0 4.0 4.2  Hemoglobin 13.0 - 17.1 g/dL 9.8(L) 10.0(L) 9.2(L)  Hematocrit 38.4 - 49.9 % 30.2(L) 31.8(L) 28.8(L)  Platelets 140 - 400 10e3/uL 241 264 303   . CBC    Component Value Date/Time   WBC 5.0 09/03/2016 0921   WBC 5.1 12/27/2015 1027   RBC 3.22 (L) 09/03/2016 0921   RBC 4.79 12/27/2015 1027   HGB 9.8 (L) 09/03/2016 0921   HCT 30.2 (L) 09/03/2016 0921   PLT 241 09/03/2016 0921   MCV 93.8 09/03/2016 0921   MCH 30.4 09/03/2016 0921   MCH 22.3 (L) 12/27/2015 1027   MCHC 32.5 09/03/2016 0921   MCHC 30.5 (L) 12/27/2015 1027   RDW 14.1 09/03/2016 0921   LYMPHSABS 0.4 (L) 09/03/2016 0921   MONOABS 0.3 09/03/2016 0921   EOSABS 0.1 09/03/2016 0921   BASOSABS 0.0 09/03/2016 0921    . CMP Latest Ref Rng & Units 09/03/2016 07/27/2016 06/29/2016  Glucose 70 - 140 mg/dl 104 93 77  BUN 7.0 - 26.0 mg/dL 7.4 8.2 9.8  Creatinine 0.7 - 1.3 mg/dL 0.9 1.0 0.9  Sodium 136 - 145 mEq/L 141 141 139  Potassium 3.5 - 5.1 mEq/L 4.1 3.8 4.3  Chloride 98 - 110 mmol/L - - -  CO2 22 - 29 mEq/L 25 28 29   Calcium 8.4 - 10.4 mg/dL 8.9 9.1 8.9  Total Protein 6.4 - 8.3 g/dL 6.8 6.7 6.7  Total Bilirubin 0.20 - 1.20 mg/dL 0.58 0.66 0.71  Alkaline Phos 40 - 150 U/L 109 106 85  AST 5 - 34 U/L 20 17 11   ALT 0 - 55 U/L 15 12 9    Component     Latest Ref Rng & Units 09/03/2016  T3 Uptake Ratio     24 - 39 % 28  Free Thyroxine Index     1.2 - 4.9 2.9  LDH     125 - 245 U/L 274 (H)  Thyroxine (T4)     4.5 - 12.0 ug/dL 10.2  T4,Free(Direct)     0.82 - 1.77 ng/dL 1.51  TSH     0.320 - 4.118 m(IU)/L 0.582         RADIOGRAPHIC STUDIES: I have personally reviewed the radiological images as listed and agreed with the findings in the report. No results found.  ASSESSMENT & PLAN:   51year-old  African-American male with   #1 Metastatic gastrointestinal stromal tumor likely arising from the small bowel with significant liver metastases. Molecular studies show KIT exon 9 mutation  PET/CT scan after about 3-4 months of treatment shows marked metabolic response to treatment and no evidence of disease progression.  Recent PET/CT scan on 07/28/2016 shows continued metabolic response with no evidence of disease progression.  #2 severe  protein calorie malnutrition - much improved.  Weight has remained relatively stable over the last few months . Wt Readings from Last 3 Encounters:  09/03/16 154 lb 8 oz (70.1 kg)  07/30/16 153 lb 12.8 oz (69.8 kg)  06/29/16 155 lb 3.2 oz (70.4 kg)   #3 neoplasm related pain - controlled.   Patient notes decreased abdominal distention and abdominal pain. He notes that he is not really needing any pain medication on a regular basis. PLAN -Patient labs are stable and he has no prohibitive toxicity from imatinib at this time. -We shall continue imatinib 400 mg by mouth twice a day. thyroid function tests remain stable -He has follow-up at [facility] with repeat imaging in May 2018 to determine surgical resectability with Dr Cristino Martes.  #4 Normocytic anemia - Iron def has resolved. 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 Stools were normal in color with no overt blood . Plan --He is aware that he should  seek immediate attention in the emergency room for dizziness lightheadedness chest pain or overt shortness of breath or overt GI bleeding. -Monitor blood counts closely.  #5 B12 deficiency  #6 Folate deficiency  Plan -Continue B12 and folate replacement .  Return to clinic with Dr. Irene Limbo in 6-7 weeks with repeat labs  I spent 20 minutes counseling the patient face to face. The total time spent in the appointment was 25 minutes and more than 50% was on counseling and direct patient  cares.    Caleb Lone MD [place] AAHIVMS [facility] Hematology/Oncology Physician [facility]  (Office):       [phone] (Work cell):  [phone] (Fax):           [phone]

## 2016-10-15 ENCOUNTER — Other Ambulatory Visit: Payer: Self-pay | Admitting: *Deleted

## 2016-10-15 ENCOUNTER — Other Ambulatory Visit (HOSPITAL_BASED_OUTPATIENT_CLINIC_OR_DEPARTMENT_OTHER): Payer: BLUE CROSS/BLUE SHIELD

## 2016-10-15 ENCOUNTER — Encounter: Payer: Self-pay | Admitting: Hematology

## 2016-10-15 ENCOUNTER — Ambulatory Visit (HOSPITAL_BASED_OUTPATIENT_CLINIC_OR_DEPARTMENT_OTHER): Payer: BLUE CROSS/BLUE SHIELD | Admitting: Hematology

## 2016-10-15 VITALS — BP 141/88 | HR 86 | Temp 98.5°F | Resp 18 | Ht 65.0 in | Wt 138.9 lb

## 2016-10-15 DIAGNOSIS — G893 Neoplasm related pain (acute) (chronic): Secondary | ICD-10-CM

## 2016-10-15 DIAGNOSIS — E538 Deficiency of other specified B group vitamins: Secondary | ICD-10-CM | POA: Diagnosis not present

## 2016-10-15 DIAGNOSIS — D649 Anemia, unspecified: Secondary | ICD-10-CM

## 2016-10-15 DIAGNOSIS — E43 Unspecified severe protein-calorie malnutrition: Secondary | ICD-10-CM | POA: Diagnosis not present

## 2016-10-15 DIAGNOSIS — C787 Secondary malignant neoplasm of liver and intrahepatic bile duct: Secondary | ICD-10-CM

## 2016-10-15 DIAGNOSIS — C49A3 Gastrointestinal stromal tumor of small intestine: Secondary | ICD-10-CM

## 2016-10-15 DIAGNOSIS — D500 Iron deficiency anemia secondary to blood loss (chronic): Secondary | ICD-10-CM

## 2016-10-15 DIAGNOSIS — C49A0 Gastrointestinal stromal tumor, unspecified site: Secondary | ICD-10-CM

## 2016-10-15 LAB — CBC & DIFF AND RETIC
BASO%: 0.2 % (ref 0.0–2.0)
Basophils Absolute: 0 10*3/uL (ref 0.0–0.1)
EOS ABS: 0.1 10*3/uL (ref 0.0–0.5)
EOS%: 1.2 % (ref 0.0–7.0)
HCT: 29.1 % — ABNORMAL LOW (ref 38.4–49.9)
HGB: 9.7 g/dL — ABNORMAL LOW (ref 13.0–17.1)
IMMATURE RETIC FRACT: 3.1 % (ref 3.00–10.60)
LYMPH#: 0.6 10*3/uL — AB (ref 0.9–3.3)
LYMPH%: 9.4 % — ABNORMAL LOW (ref 14.0–49.0)
MCH: 30 pg (ref 27.2–33.4)
MCHC: 33.3 g/dL (ref 32.0–36.0)
MCV: 90.1 fL (ref 79.3–98.0)
MONO#: 0.4 10*3/uL (ref 0.1–0.9)
MONO%: 6.4 % (ref 0.0–14.0)
NEUT%: 82.8 % — ABNORMAL HIGH (ref 39.0–75.0)
NEUTROS ABS: 4.9 10*3/uL (ref 1.5–6.5)
NRBC: 0 % (ref 0–0)
Platelets: 338 10*3/uL (ref 140–400)
RBC: 3.23 10*6/uL — AB (ref 4.20–5.82)
RDW: 14.8 % — AB (ref 11.0–14.6)
RETIC %: 1.1 % (ref 0.80–1.80)
Retic Ct Abs: 35.53 10*3/uL (ref 34.80–93.90)
WBC: 5.9 10*3/uL (ref 4.0–10.3)

## 2016-10-15 LAB — COMPREHENSIVE METABOLIC PANEL
ALT: 14 U/L (ref 0–55)
AST: 18 U/L (ref 5–34)
Albumin: 3.7 g/dL (ref 3.5–5.0)
Alkaline Phosphatase: 113 U/L (ref 40–150)
Anion Gap: 10 mEq/L (ref 3–11)
BILIRUBIN TOTAL: 0.53 mg/dL (ref 0.20–1.20)
BUN: 10.1 mg/dL (ref 7.0–26.0)
CO2: 26 meq/L (ref 22–29)
Calcium: 9.5 mg/dL (ref 8.4–10.4)
Chloride: 103 mEq/L (ref 98–109)
Creatinine: 1.1 mg/dL (ref 0.7–1.3)
EGFR: >90 mL/min/{1.73_m2} (ref >90)
GLUCOSE: 116 mg/dL (ref 70–140)
POTASSIUM: 3.5 meq/L (ref 3.5–5.1)
SODIUM: 138 meq/L (ref 136–145)
TOTAL PROTEIN: 7.5 g/dL (ref 6.4–8.3)

## 2016-10-15 LAB — LACTATE DEHYDROGENASE: LDH: 229 U/L (ref 125–245)

## 2016-10-15 MED ORDER — OXYCODONE HCL 5 MG PO TABS: 5.0000 mg | ORAL_TABLET | Freq: Four times a day (QID) | ORAL | 0 refills | Status: DC | PRN

## 2016-10-15 NOTE — Progress Notes (Signed)
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  INTERVAL HISTORY:  Mr Caleb Foley here for followup of his metastatic GIST tumor and anemia. He is tolerating  Imatinib without any new concerns and notes that his appetite has been stable. [facility] showed persistent extensive liver metastases which were not thought to be resectable. No overt progression of disease. [facility] who recommended continued imatinib 400 mg by mouth twice a day and no other new changes in his plan. She is planning to offer him possible phase 3 phase III GIST study with [id number] the second-line setting when he does have ceased progression. [facility] continue his medical oncology care as there. We discussed and decided based on his preference that we will allow them to take over his primary medical oncology cares and will not set up a specific [place] this [place].  He was seen in urgent care for left foot pain thought to be related to gout and was given NSAIDs. Symptoms are better but not resolved. [facility] He was informed about avoiding excessive NSAID use due to risk of recurrent GI bleeding.  He is aware that [facility]  REVIEW OF SYSTEMS:    10 Point review of systems of done and is negative except as noted above.  . Past  Medical History:  Diagnosis Date  . Arthritis   . Chronic back pain   . G6PD deficiency anemia ([place]   . Umbilical hernia     . Past Surgical History:  Procedure Laterality Date  . HERNIA REPAIR  04/30/11   umb hernia  . LUMBAR LAMINECTOMY/DECOMPRESSION MICRODISCECTOMY  06/17/2012   Procedure: LUMBAR LAMINECTOMY/DECOMPRESSION MICRODISCECTOMY 1 LEVEL;  Surgeon: KWinfield Cunas MD;  [place];  Service: Neurosurgery;  Laterality: Right;  RIGHT Lumbar Four-five diskectomy    . Social History  Substance Use Topics  . Smoking status: Current Every Day Smoker    Packs/day: 1.00    Years: 5.00    Types: Cigars  . Smokeless tobacco: Never Used     Comment: smoking 1-2 black n milds a day  occ alcohol weekends   . Alcohol use Yes     Comment: "social"    ALLERGIES:  has No Known Allergies.  MEDICATIONS:  Current Outpatient Prescriptions  Medication Sig Dispense Refill  . folic acid (FOLVITE) 1 MG tablet Take 1 tablet (1 mg total) by mouth daily.    .Caleb Kitchenimatinib (GLEEVEC) 400 MG tablet Take 1 tablet (400 mg total) by mouth 2 (two) times daily. Take with meals and large glass of water.Caution:Chemotherapy. 60 tablet 6  . ondansetron (ZOFRAN) 8 MG tablet Take 1 tablet (8 mg total) by mouth every 8 (eight) hours as needed for nausea or vomiting. 30 tablet  3  . oxyCODONE (OXY IR/ROXICODONE) 5 MG immediate release tablet Take 1 tablet (5 mg total) by mouth every 6 (six) hours as needed for moderate pain or severe pain. 30 tablet 0  . senna-docusate (SENNA S) 8.6-50 MG tablet Take 2 tablets by mouth at bedtime as needed for mild constipation or moderate constipation. 60 tablet 1   No current facility-administered medications for this visit.     PHYSICAL EXAMINATION: ECOG PERFORMANCE STATUS: 1 - Symptomatic but completely ambulatory  . Vitals:   10/15/16 0904  BP: (!) 141/88  Pulse: 86  Resp: 18  Temp: 98.5 F (36.9 C)   . Wt Readings from Last 3 Encounters:    10/15/16 138 lb 14.4 oz (63 kg)  09/03/16 154 lb 8 oz (70.1 kg)  07/30/16 153 lb 12.8 oz (69.8 kg)    GENERAL:alert, in no acute distress and comfortable SKIN: skin color, texture, turgor are normal, no rashes or significant lesions EYES: normal, conjunctiva are pink and non-injected, sclera clear OROPHARYNX:no exudate, no erythema and lips, buccal mucosa, and tongue normal  NECK: supple, no JVD, thyroid normal size, non-tender, without nodularity LYMPH:  no palpable lymphadenopathy in the cervical, axillary or inguinal LUNGS: clear to auscultation with normal respiratory effort HEART: regular rate & rhythm,  no murmurs and no lower extremity edema ABDOMEN: abd distended with palpable hard mass in the middle to lower abdomen PSYCH: alert & oriented x 3 with fluent speech NEURO: no focal motor/sensory deficits  LABORATORY DATA:   I have reviewed the data as listed  . CBC Latest Ref Rng & Units 10/15/2016 09/03/2016 07/27/2016  WBC 4.0 - 10.3 10e3/uL 5.9 5.0 4.0  Hemoglobin 13.0 - 17.1 g/dL 9.7(L) 9.8(L) 10.0(L)  Hematocrit 38.4 - 49.9 % 29.1(L) 30.2(L) 31.8(L)  Platelets 140 - 400 10e3/uL 338 241 264   . CBC    Component Value Date/Time   WBC 5.9 10/15/2016 0838   WBC 5.1 12/27/2015 1027   RBC 3.23 (L) 10/15/2016 0838   RBC 4.79 12/27/2015 1027   HGB 9.7 (L) 10/15/2016 0838   HCT 29.1 (L) 10/15/2016 0838   PLT 338 10/15/2016 0838   MCV 90.1 10/15/2016 0838   MCH 30.0 10/15/2016 0838   MCH 22.3 (L) 12/27/2015 1027   MCHC 33.3 10/15/2016 0838   MCHC 30.5 (L) 12/27/2015 1027   RDW 14.8 (H) 10/15/2016 0838   LYMPHSABS 0.6 (L) 10/15/2016 0838   MONOABS 0.4 10/15/2016 0838   EOSABS 0.1 10/15/2016 0838   BASOSABS 0.0 10/15/2016 0838    . CMP Latest Ref Rng & Units 10/15/2016 09/03/2016 07/27/2016  Glucose 70 - 140 mg/dl 116 104 93  BUN 7.0 - 26.0 mg/dL 10.1 7.4 8.2  Creatinine 0.7 - 1.3 mg/dL 1.1 0.9 1.0  Sodium 136 - 145 mEq/L 138 141 141  Potassium 3.5 - 5.1 mEq/L 3.5  4.1 3.8  Chloride 98 - 110 mmol/L - - -  CO2 22 - 29 mEq/L 26 25 28   Calcium 8.4 - 10.4 mg/dL 9.5 8.9 9.1  Total Protein 6.4 - 8.3 g/dL 7.5 6.8 6.7  Total Bilirubin 0.20 - 1.20 mg/dL 0.53 0.58 0.66  Alkaline Phos 40 - 150 U/L 113 109 106  AST 5 - 34 U/L 18 20 17   ALT 0 - 55 U/L 14 15 12          RADIOGRAPHIC STUDIES: I have personally reviewed the radiological images as listed and agreed with the findings in the report.  MRI abdomen with and without contrast5/12/2016  [place] Result Narrative  Procedure:MRI Abdomen with and without contrast  Indication: 89 years y/o Male with Metastatic GIST to liver. Multiple lesions, want to characterize, C49.A3 Gastrointestinal stromal tumor of small intestine (CMS-HCC) status post [place] 30, 2018, April 27, 2016, December 15, 2015.  Technique: Precontrast and dynamic postcontrast MR imaging of the abdomen was performed using the Liver Protocol. IV contrast was administered to improve disease detection and further define anatomy.  Contrast agent:14 mL of MultiHance administered intravenously. eGFR: Greater than 60  Premedication/adverse events:None.  Findings:  Limited included imaging of the thorax is negative for focal consolidation, pleural effusion, or pericardial effusion.  Gallbladder unremarkable. No biliary dilation. Extensive hepatic metastatic disease is present. The largest conglomerate tumor is in the left hepatic lobe and measures 14.2 x 21.8 cm on coronal series 19 image 18, previously 13.9 x 19.7 cm when measured similarly. Slight differences in measurement may reflect slight differences in technique and modality. This large metastasis has large areas of necrosis. However, there is diffuse capsular enhancement and internal nodular enhancement. Largest metastasis in the right hepatic lobe measures 5.8 x 6.9 cm on series 19 image 31, previously 6.0 x 6.8 cm measured  similarly. Most of this metastasis is necrotic. However, there are several other lesions in the liver which demonstrate avid enhancement throughout. For example, in the inferior right hepatic lobe measuring 2.4 x 3.0 cm, previously 2.5 x 3.1 cm. The left portal venous system is severely compressed. The right portal veins are patent. The main portal vein is patent.  Trace ascites noted. The main pancreatic duct is not dilated. No focal pancreatic lesion. Spleen appears normal. Multiple renal cysts are noted. The largest is in the upper pole the right kidney and has a few thin septations. For reference see series 18 image 34 on which the cyst measures up to 3.1 cm, no significant change. No hydronephrosis. Adrenal glands are unremarkable. Abdominal aorta is normal in caliber. Major branching arteries are patent centrally. Evaluation for lymphadenopathy is limited by lack of intra-abdominal fat. No definitive lymphadenopathy. No aggressive appearing osseous lesions. Evaluation of known mass in the pelvis is extremely limited. This is not a dedicated pelvic protocol. Please see prior CT for further detail.  Impression: Extensive hepatic metastatic disease in both hepatic lobes which is not significantly changed compared with prior CT of June 16, 2016 allowing for differences in modality. Some lesions demonstrate significant necrotic change although with areas of persistent enhancement consistent with some viable tissue. Other lesions demonstrate diffuse solid enhancement.  Electronically Reviewed YO:MAYOKHTX Lodema Hong, MD, [facility] Electronically Reviewed on:09/22/2016 11:03 AM  I have reviewed the images and concur with the above findings.  Electronically Signed HF:SFSE Sabra Heck, MD, [facility] Electronically Signed on:09/22/2016 8:18 PM    ASSESSMENT & PLAN:   51year-old African-American male with   #1 Metastatic gastrointestinal stromal tumor likely arising from the  small bowel with significant liver metastases. Molecular studies show KIT exon 9 mutation  PET/CT scan after about 3-4 months of treatment shows marked metabolic response to treatment and no evidence of disease progression.  Recent PET/CT scan on 07/28/2016 shows continued metabolic response with no evidence of disease progression.  #2 severe protein calorie malnutrition  Has lost a fair amount of weight since his last clinic visit  Wt Readings from Last 3 Encounters:  10/15/16 138 lb 14.4 oz (63 kg)  09/03/16 154 lb 8 oz (70.1 kg)  07/30/16 153 lb 12.8 oz (69.8 kg)   #  3 neoplasm related pain - controlled.   Patient notes decreased abdominal distention and abdominal pain. He notes that he is not really needing any pain medication on a regular basis. PLAN -Patient labs are stable and he has no prohibitive toxicity from imatinib at this time. -No clear evidence of disease progression based on imaging at this time. -We shall continue imatinib 400 mg by mouth twice a day.  Understands the importance of treatment compliance. -Not a candidate for surgical resection at this time due to presence of extensive disease . -He had short-term disability paperwork that was addressed.  #4 Normocytic anemia - Iron def has resolved. 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 Stools were normal in color with no overt blood . Plan --He is aware that he should  seek immediate attention in the emergency room for dizziness lightheadedness chest pain or overt shortness of breath or overt GI bleeding. -Monitor blood counts closely.  #5 B12 deficiency  #6 Folate deficiency  Plan -Continue B12 and folate replacement .   Patient will transfer his primary oncology care's to Arma Heading, MD medical oncology at [facility]. He understands that after his next month imatinib he will need all further refills from his new primary oncologist.  Further pain management and nutritional support and all other ancillary services will be coordinated through his [place] oncologist going ahead.  He was also strongly recommended to set up a primary care physician to manage his other medical issues.  I spent 25 minutes counseling the patient face to face. The total time spent in the appointment was 40 minutes and more than 50% was on counseling and direct patient cares.    Sullivan Lone MD [place] AAHIVMS [facility] Hematology/Oncology Physician [facility]  (Office):       [phone] (Work cell):  [phone] (Fax):           [phone]

## 2016-10-15 NOTE — Patient Instructions (Signed)
Thank you for choosing [facility] to provide your oncology and hematology care.  To afford each patient quality time with our providers, please arrive 30 minutes before your scheduled appointment time.  If you arrive late for your appointment, you may be asked to reschedule.  We strive to give you quality time with our providers, and arriving late affects you and other patients whose appointments are after yours.  If you are a no show for multiple scheduled visits, you may be dismissed from the clinic at the providers discretion.   Again, thank you for choosing [facility], our hope is that these requests will decrease the amount of time that you wait before being seen by our physicians.  ______________________________________________________________________ Should you have questions after your visit to the [facility], please contact our office at [phone] between the hours of 8:30 and 4:30 p.m.    Voicemails left after 4:30p.m will not be returned until the following business day.   For prescription refill requests, please have your pharmacy contact us directly.  Please also try to allow 48 hours for prescription requests.   Please contact the scheduling department for questions regarding scheduling.  For scheduling of procedures such as PET scans, CT scans, MRI, Ultrasound, etc please contact central scheduling at [phone].   Resources For Cancer Patients and Caregivers:  American Cancer Society:  800-227-2345  Can help patients locate various types of support and financial assistance Cancer Care: 1-800-813-HOPE (4673) Provides financial assistance, online support groups, medication/co-pay assistance.   [place] DSS:  336-641-3447 Where to apply for food stamps, Medicaid, and utility assistance Medicare Rights Center: 800-333-4114 Helps people with Medicare understand their rights and benefits, navigate the Medicare system, and secure the  quality healthcare they deserve SCAT: 336-333-6589 [place] [employer]'s shared-ride transportation service for eligible riders who have a disability that prevents them from riding the fixed route bus.   For additional information on assistance programs please contact our social worker:   Grier Hock/Abigail Elmore:  [phone] 

## 2016-10-16 ENCOUNTER — Telehealth: Payer: Self-pay | Admitting: Hematology

## 2016-10-16 NOTE — Telephone Encounter (Signed)
Per 5/31 LOS - no additional appts scheduled. -Patient will be to continuing further medical oncology cares at [facility]. No f/u setup with [place] going ahead.

## 2016-11-12 ENCOUNTER — Other Ambulatory Visit: Payer: Self-pay

## 2016-11-12 ENCOUNTER — Other Ambulatory Visit: Payer: Self-pay | Admitting: Hematology

## 2016-11-12 DIAGNOSIS — C49A0 Gastrointestinal stromal tumor, unspecified site: Secondary | ICD-10-CM

## 2016-11-12 MED ORDER — OXYCODONE HCL 5 MG PO TABS: 5.0000 mg | ORAL_TABLET | Freq: Four times a day (QID) | ORAL | 0 refills | Status: DC | PRN

## 2016-11-13 ENCOUNTER — Telehealth: Payer: Self-pay

## 2016-11-13 NOTE — Telephone Encounter (Signed)
Called pt and told him that we will no longer be the contact for prescription refills. Pt verbalized understanding.

## 2017-01-07 ENCOUNTER — Ambulatory Visit (INDEPENDENT_AMBULATORY_CARE_PROVIDER_SITE_OTHER): Payer: Self-pay | Admitting: Physician Assistant

## 2017-01-07 ENCOUNTER — Encounter: Payer: Self-pay | Admitting: Physician Assistant

## 2017-01-07 VITALS — BP 145/90 | HR 70 | Temp 98.3°F | Resp 16 | Ht 64.61 in | Wt 132.6 lb

## 2017-01-07 DIAGNOSIS — Z0289 Encounter for other administrative examinations: Secondary | ICD-10-CM

## 2017-01-07 NOTE — Patient Instructions (Signed)
     IF you received an x-ray today, you will receive an invoice from [facility]. Please contact [facility] at [phone] with questions or concerns regarding your invoice.   IF you received labwork today, you will receive an invoice from [facility]. Please contact [facility] at [phone] with questions or concerns regarding your invoice.   Our billing staff will not be able to assist you with questions regarding bills from these companies.  You will be contacted with the lab results as soon as they are available. The fastest way to get your results is to activate your My Chart account. Instructions are located on the last page of this paperwork. If you have not heard from us regarding the results in 2 weeks, please contact this office.     

## 2017-01-07 NOTE — Progress Notes (Signed)
   Games developer Medical Examination   Caleb Foley is a 51 y.o. male with a pertinent medical history of gastric cancer managed at [facility] who presents today for a commercial driver fitness determination physical exam. The patient reports no problems today. In the past the patient reports receiving 2 year certificates. He denies focal neurological deficits, vision and hearing changes. He denies the habitual use of benzodiazepines, opioids, amphetamines and denies illicit drug use.   Current medications, family history, allergies, social history reviewed by me and exist elsewhere in the encounter.   Review of Systems  Neurological: Positive for focal weakness.    Objective:     Vision/hearing:  Visual Acuity Screening   Right eye Left eye Both eyes  Without correction:     With correction: 20/20 20/20 20/20     Applicant can recognize and distinguish among traffic control signals and devices showing standard red, green, and amber colors.  Corrective lenses required: Yes  Monocular Vision?: No  Hearing aid requirement: No  Physical Exam  Neurological: No cranial nerve deficit or sensory deficit. Gait abnormal.  He is unable to dorsiflex his left foot. Grip intact bilaterally. Cranial nerves intact to challenge. Speech clear.  Patient oriented to person, place and time.     BP (!) 145/90 (BP Location: Right Arm, Patient Position: Sitting, Cuff Size: Normal)   Pulse 70   Temp 98.3 F (36.8 C) (Oral)   Resp 16   Ht 5' 4.61" (1.641 m)   Wt 132 lb 9.6 oz (60.1 kg)   SpO2 100%   BMI 22.34 kg/m   Labs:    Assessment:    51 year old male here today with a focal neurological deficit that started 2 months ago.  Advised that he follow up with his PCP (he states he has one) for this problem.    Does not meet standards in 49 CFR 391.41.    Plan:     Referred to PCP for further eval of chronic left dorsiflexor focal weakness. Return as needed.    Philis Fendt, MS,  PA-C 4:04 PM, 01/07/2017

## 2018-04-24 IMAGING — PT NM PET TUM IMG INITIAL (PI) SKULL BASE T - THIGH
1 of 8 series · 1 of 25 positions shown · non-contrast
Comparison: CT abdomen pelvis 12/15/2015

CLINICAL DATA: Initial treatment strategy for gastrointestinal
stromal tumor.

EXAM:
NUCLEAR MEDICINE PET SKULL BASE TO THIGH
TECHNIQUE: 7.3 mCi F-18 FDG was injected intravenously. Full-ring PET imaging
was performed from the skull base to thigh after the radiotracer. CT
data was obtained and used for attenuation correction and anatomic
localization.
FASTING BLOOD GLUCOSE:  Value: 107 mg/dl

[Series 4: ct sk_thigh 5.0 b31f · axial · 5.0mm · 0.98mm/px · 1 of 217 slices shown]
[im 217/217  brain]
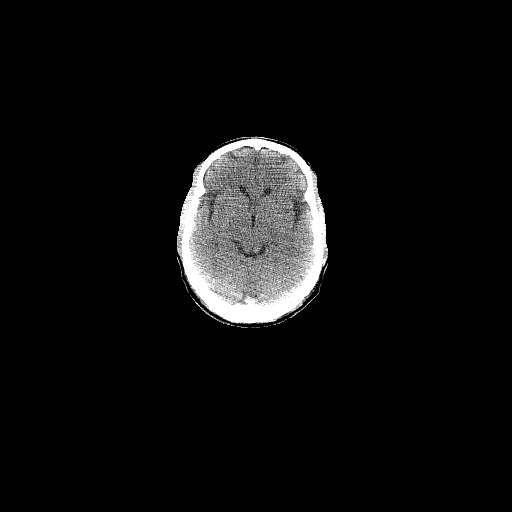

[1 of 25 positions shown; findings below may reference images not displayed]

FINDINGS: NECK

No GI

CHEST

No hypermetabolic mediastinal or hilar nodes. No suspicious
pulmonary nodules on the CT scan.

ABDOMEN/PELVIS

Multiple hypermetabolic hepatic metastasis are present. Large lesion
occupying the near entirety of the LEFT hepatic lobe measures 15.6 x
14.7 cm with SUV max equal 14. Similar smaller lesions in the RIGHT
hepatic lobe with SUV max equal 11.1.

No abnormal metabolic activity associated the stomach. No
hypermetabolic lesion within the small bowel or colon.

Within the pelvis, large lobular mass is present with metabolic
activity. Mass measures approximately 12.5 by 6.4 cm with
heterogeneous metabolic activity with SUV max equal 12.9. Difficult
to separate mass from the small bowel.

No hypermetabolic iliac lymph nodes. Inguinal lymph nodes, or
retroperitoneal lymph nodes.

SKELETON

No focal hypermetabolic activity to suggest skeletal metastasis.
IMPRESSION: 1. Multiple large hypermetabolic hepatic metastasis.
2. Large hypermetabolic mass within the pelvis. Mass is inseparable
from the small bowel. No evidence of bowel obstruction.
3. No thoracic metastasis.

## 2019-03-05 ENCOUNTER — Inpatient Hospital Stay (HOSPITAL_COMMUNITY)
Admission: EM | Admit: 2019-03-05 | Discharge: 2019-03-07 | DRG: 375 | Disposition: A | Discharge status: Home / Self Care | Attending: Internal Medicine | Admitting: Internal Medicine

## 2019-03-05 ENCOUNTER — Encounter (HOSPITAL_COMMUNITY): Payer: Self-pay | Admitting: Emergency Medicine

## 2019-03-05 ENCOUNTER — Emergency Department (HOSPITAL_COMMUNITY)

## 2019-03-05 ENCOUNTER — Other Ambulatory Visit: Payer: Self-pay

## 2019-03-05 DIAGNOSIS — K5909 Other constipation: Secondary | ICD-10-CM | POA: Diagnosis present

## 2019-03-05 DIAGNOSIS — Z7189 Other specified counseling: Secondary | ICD-10-CM

## 2019-03-05 DIAGNOSIS — C7989 Secondary malignant neoplasm of other specified sites: Secondary | ICD-10-CM | POA: Diagnosis present

## 2019-03-05 DIAGNOSIS — Z87891 Personal history of nicotine dependence: Secondary | ICD-10-CM

## 2019-03-05 DIAGNOSIS — C787 Secondary malignant neoplasm of liver and intrahepatic bile duct: Secondary | ICD-10-CM | POA: Diagnosis present

## 2019-03-05 DIAGNOSIS — K5900 Constipation, unspecified: Secondary | ICD-10-CM | POA: Diagnosis not present

## 2019-03-05 DIAGNOSIS — R160 Hepatomegaly, not elsewhere classified: Secondary | ICD-10-CM | POA: Diagnosis present

## 2019-03-05 DIAGNOSIS — Z79891 Long term (current) use of opiate analgesic: Secondary | ICD-10-CM

## 2019-03-05 DIAGNOSIS — C49A3 Gastrointestinal stromal tumor of small intestine: Principal | ICD-10-CM | POA: Diagnosis present

## 2019-03-05 DIAGNOSIS — D62 Acute posthemorrhagic anemia: Secondary | ICD-10-CM | POA: Diagnosis not present

## 2019-03-05 DIAGNOSIS — Z9221 Personal history of antineoplastic chemotherapy: Secondary | ICD-10-CM

## 2019-03-05 DIAGNOSIS — Z66 Do not resuscitate: Secondary | ICD-10-CM | POA: Diagnosis not present

## 2019-03-05 DIAGNOSIS — E538 Deficiency of other specified B group vitamins: Secondary | ICD-10-CM | POA: Diagnosis present

## 2019-03-05 DIAGNOSIS — Z20828 Contact with and (suspected) exposure to other viral communicable diseases: Secondary | ICD-10-CM | POA: Diagnosis present

## 2019-03-05 DIAGNOSIS — G893 Neoplasm related pain (acute) (chronic): Secondary | ICD-10-CM

## 2019-03-05 DIAGNOSIS — D649 Anemia, unspecified: Secondary | ICD-10-CM

## 2019-03-05 DIAGNOSIS — L02211 Cutaneous abscess of abdominal wall: Secondary | ICD-10-CM | POA: Diagnosis present

## 2019-03-05 DIAGNOSIS — L03311 Cellulitis of abdominal wall: Secondary | ICD-10-CM | POA: Diagnosis present

## 2019-03-05 DIAGNOSIS — K922 Gastrointestinal hemorrhage, unspecified: Secondary | ICD-10-CM | POA: Diagnosis present

## 2019-03-05 DIAGNOSIS — D630 Anemia in neoplastic disease: Secondary | ICD-10-CM | POA: Diagnosis present

## 2019-03-05 DIAGNOSIS — D509 Iron deficiency anemia, unspecified: Secondary | ICD-10-CM | POA: Diagnosis present

## 2019-03-05 DIAGNOSIS — F419 Anxiety disorder, unspecified: Secondary | ICD-10-CM | POA: Diagnosis present

## 2019-03-05 DIAGNOSIS — C49A0 Gastrointestinal stromal tumor, unspecified site: Secondary | ICD-10-CM | POA: Diagnosis present

## 2019-03-05 DIAGNOSIS — R1900 Intra-abdominal and pelvic swelling, mass and lump, unspecified site: Secondary | ICD-10-CM | POA: Diagnosis present

## 2019-03-05 DIAGNOSIS — Z79899 Other long term (current) drug therapy: Secondary | ICD-10-CM

## 2019-03-05 DIAGNOSIS — D550 Anemia due to glucose-6-phosphate dehydrogenase [G6PD] deficiency: Secondary | ICD-10-CM | POA: Diagnosis present

## 2019-03-05 DIAGNOSIS — Z515 Encounter for palliative care: Secondary | ICD-10-CM

## 2019-03-05 LAB — COMPREHENSIVE METABOLIC PANEL
ALT: 15 U/L (ref 0–44)
AST: 14 U/L — ABNORMAL LOW (ref 15–41)
Albumin: 2.3 g/dL — ABNORMAL LOW (ref 3.5–5.0)
Alkaline Phosphatase: 147 U/L — ABNORMAL HIGH (ref 38–126)
Anion gap: 9 (ref 5–15)
BUN: 15 mg/dL (ref 6–20)
CO2: 25 mmol/L (ref 22–32)
Calcium: 9.5 mg/dL (ref 8.9–10.3)
Chloride: 102 mmol/L (ref 98–111)
Creatinine, Ser: 0.64 mg/dL (ref 0.61–1.24)
GFR calc Af Amer: >60 mL/min (ref >60)
GFR calc non Af Amer: >60 mL/min (ref >60)
Glucose, Bld: 137 mg/dL — ABNORMAL HIGH (ref 70–99)
Potassium: 3.9 mmol/L (ref 3.5–5.1)
Sodium: 136 mmol/L (ref 135–145)
Total Bilirubin: 0.6 mg/dL (ref 0.3–1.2)
Total Protein: 7.7 g/dL (ref 6.5–8.1)

## 2019-03-05 LAB — URINALYSIS, ROUTINE W REFLEX MICROSCOPIC
Bacteria, UA: NONE SEEN
Bilirubin Urine: NEGATIVE
Glucose, UA: NEGATIVE mg/dL
Hgb urine dipstick: NEGATIVE
Ketones, ur: NEGATIVE mg/dL
Leukocytes,Ua: NEGATIVE
Nitrite: NEGATIVE
Protein, ur: 30 mg/dL — AB
Specific Gravity, Urine: 1.02 (ref 1.005–1.030)
pH: 6 (ref 5.0–8.0)

## 2019-03-05 LAB — CBC WITH DIFFERENTIAL/PLATELET
Abs Immature Granulocytes: 0.03 10*3/uL (ref 0.00–0.07)
Basophils Absolute: 0 10*3/uL (ref 0.0–0.1)
Basophils Relative: 0 %
Eosinophils Absolute: 0 10*3/uL (ref 0.0–0.5)
Eosinophils Relative: 0 %
HCT: 23.3 % — ABNORMAL LOW (ref 39.0–52.0)
Hemoglobin: 6.8 g/dL — CL (ref 13.0–17.0)
Immature Granulocytes: 0 %
Lymphocytes Relative: 4 %
Lymphs Abs: 0.3 10*3/uL — ABNORMAL LOW (ref 0.7–4.0)
MCH: 23 pg — ABNORMAL LOW (ref 26.0–34.0)
MCHC: 29.2 g/dL — ABNORMAL LOW (ref 30.0–36.0)
MCV: 78.7 fL — ABNORMAL LOW (ref 80.0–100.0)
Monocytes Absolute: 0.5 10*3/uL (ref 0.1–1.0)
Monocytes Relative: 7 %
Neutro Abs: 6.9 10*3/uL (ref 1.7–7.7)
Neutrophils Relative %: 89 %
Platelets: 463 10*3/uL — ABNORMAL HIGH (ref 150–400)
RBC: 2.96 MIL/uL — ABNORMAL LOW (ref 4.22–5.81)
RDW: 19.2 % — ABNORMAL HIGH (ref 11.5–15.5)
WBC: 7.7 10*3/uL (ref 4.0–10.5)
nRBC: 0 % (ref 0.0–0.2)

## 2019-03-05 LAB — LIPASE, BLOOD: Lipase: 12 U/L (ref 11–51)

## 2019-03-05 LAB — PREPARE RBC (CROSSMATCH)

## 2019-03-05 LAB — SARS CORONAVIRUS 2 (TAT 6-24 HRS): SARS Coronavirus 2: NEGATIVE

## 2019-03-05 LAB — ABO/RH: ABO/RH(D): O POS

## 2019-03-05 LAB — POC OCCULT BLOOD, ED: Fecal Occult Bld: POSITIVE — AB

## 2019-03-05 MED ORDER — POLYETHYLENE GLYCOL 3350 17 G PO PACK
17.0000 g | PACK | Freq: Every day | ORAL | Status: DC
  Administered 2019-03-05 – 2019-03-07 (×2): 17 g via ORAL
  Filled 2019-03-05 (×2): qty 1

## 2019-03-05 MED ORDER — PIPERACILLIN-TAZOBACTAM 3.375 G IVPB
3.3750 g | Freq: Once | INTRAVENOUS | Status: AC
  Administered 2019-03-05: 18:00:00 3.375 g via INTRAVENOUS
  Filled 2019-03-05: qty 50

## 2019-03-05 MED ORDER — VANCOMYCIN HCL IN DEXTROSE 1-5 GM/200ML-% IV SOLN
1000.0000 mg | Freq: Once | INTRAVENOUS | Status: AC
  Administered 2019-03-05: 1000 mg via INTRAVENOUS
  Filled 2019-03-05: qty 200

## 2019-03-05 MED ORDER — BOOST / RESOURCE BREEZE PO LIQD CUSTOM
1.0000 | Freq: Three times a day (TID) | ORAL | Status: DC
  Administered 2019-03-05 – 2019-03-07 (×5): 1 via ORAL

## 2019-03-05 MED ORDER — OXYCODONE HCL 5 MG PO TABS
5.0000 mg | ORAL_TABLET | ORAL | Status: DC | PRN
  Administered 2019-03-06: 5 mg via ORAL
  Filled 2019-03-05: qty 1

## 2019-03-05 MED ORDER — SODIUM CHLORIDE 0.9 % IV SOLN
INTRAVENOUS | Status: DC
  Administered 2019-03-06: 01:00:00 via INTRAVENOUS

## 2019-03-05 MED ORDER — LORAZEPAM 0.5 MG PO TABS
0.2500 mg | ORAL_TABLET | Freq: Once | ORAL | Status: AC
  Administered 2019-03-05: 23:00:00 0.25 mg via ORAL
  Filled 2019-03-05: qty 1

## 2019-03-05 MED ORDER — SODIUM CHLORIDE (PF) 0.9 % IJ SOLN
INTRAMUSCULAR | Status: AC
  Filled 2019-03-05: qty 50

## 2019-03-05 MED ORDER — PIPERACILLIN-TAZOBACTAM 3.375 G IVPB
3.3750 g | Freq: Three times a day (TID) | INTRAVENOUS | Status: DC
  Administered 2019-03-06 – 2019-03-07 (×4): 3.375 g via INTRAVENOUS
  Filled 2019-03-05 (×6): qty 50

## 2019-03-05 MED ORDER — SODIUM CHLORIDE 0.9 % IV SOLN: 10.0000 mL/h | Freq: Once | INTRAVENOUS | Status: DC

## 2019-03-05 MED ORDER — ONDANSETRON HCL 4 MG/2ML IJ SOLN
4.0000 mg | Freq: Once | INTRAMUSCULAR | Status: AC
  Administered 2019-03-05: 15:00:00 4 mg via INTRAVENOUS
  Filled 2019-03-05: qty 2

## 2019-03-05 MED ORDER — SENNOSIDES-DOCUSATE SODIUM 8.6-50 MG PO TABS
1.0000 | ORAL_TABLET | Freq: Two times a day (BID) | ORAL | Status: DC
  Administered 2019-03-05 – 2019-03-07 (×2): 1 via ORAL
  Filled 2019-03-05 (×2): qty 1

## 2019-03-05 MED ORDER — IOHEXOL 300 MG/ML  SOLN
100.0000 mL | Freq: Once | INTRAMUSCULAR | Status: AC | PRN
  Administered 2019-03-05: 100 mL via INTRAVENOUS

## 2019-03-05 MED ORDER — SODIUM CHLORIDE 0.9 % IV BOLUS
1000.0000 mL | Freq: Once | INTRAVENOUS | Status: AC
  Administered 2019-03-05: 15:00:00 1000 mL via INTRAVENOUS

## 2019-03-05 MED ORDER — FOLIC ACID 1 MG PO TABS
1.0000 mg | ORAL_TABLET | Freq: Every day | ORAL | Status: DC
  Administered 2019-03-05 – 2019-03-07 (×2): 1 mg via ORAL
  Filled 2019-03-05 (×3): qty 1

## 2019-03-05 MED ORDER — FLEET ENEMA 7-19 GM/118ML RE ENEM: 1.0000 | ENEMA | Freq: Once | RECTAL | Status: DC

## 2019-03-05 MED ORDER — MORPHINE SULFATE (PF) 4 MG/ML IV SOLN
4.0000 mg | Freq: Once | INTRAVENOUS | Status: AC
  Administered 2019-03-05: 15:00:00 4 mg via INTRAVENOUS
  Filled 2019-03-05: qty 1

## 2019-03-05 NOTE — ED Notes (Signed)
ED Provider at bedside. 

## 2019-03-05 NOTE — ED Triage Notes (Signed)
Per patient/wife-states sore above umbilicus-abdominal distention and constipation-cancer patient although no longer receiving chemo

## 2019-03-05 NOTE — H&P (Signed)
History and Physical    Caleb Foley [id number] DOB: Nov 08, 1965 DOA: 03/05/2019  PCP: Patient, No Pcp Per   Patient coming from: Constipation,abdominal swelling/ discomfort    Chief Complaint:   HPI: Caleb Foley is a 53 y.o. 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  Patient has history of metastatic GIST tumor and follows at [facility].  His oncologist has stopped the chemotherapy due to progression of the disease and currently he is not on any treatment.  Patient is passing flatus, denies any nausea or vomiting.  He complains of vague abdominal discomfort and says his abdomen has grown bigger in size.As per the wife and patient,they have noticed the abdomen bulging in size and has turned red.  Patient currently taking Percocet for pain control at home.  His last chemotherapy was about 6 months ago. Patient seen and examined the bedside in the emergency department.  Currently he is hemodynamically stable but  blood pressure on the soft side.  Hemoglobin was found to be 6.8.  FOBT was positive. CT abdomen/pelvis done in the emergency department showed multiple new and enlarging hepatic metastases with very large dominant LEFT hepatic/abdominal metastasis compressing many structures within the abdomen and UPPER pelvis.Also showed  ill-defined abnormal soft tissue masses/bowel wall thickening within the pelvis likely representing this patient's GI stromal tumor/metastatic disease.There is also concern for abdominal wall cellulitis vs intra abdominal abscess. Patient denied any fever, chills, chest pain, shortness of breath, dysuria, diarrhea.  ED Course: Hemoglobin found to be 6.8.  He is being transfused with 1 unit of PRBC.  Review of Systems: As per HPI otherwise 10 point review of systems negative.      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 His smoking use included cigars. He has a 5.00 pack-year smoking history. He has never used smokeless tobacco. He reports current alcohol use. He reports that he does not use drugs.  No Known Allergies  No family history on file.   Prior to Admission medications   Medication Sig Start Date End Date Taking? Authorizing Provider  folic acid (FOLVITE) 1 MG tablet Take 1 tablet (1 mg total) by mouth daily. 12/21/15   Cherene Altes, MD  imatinib (GLEEVEC) 400 MG tablet Take 1 tablet (400 mg total) by mouth 2 (two) times daily. Take with meals and large glass of water.Caution:Chemotherapy. 06/29/16   Brunetta Genera, MD  senna-docusate (SENNA S) 8.6-50 MG tablet Take 2 tablets by mouth at bedtime as needed for mild constipation or moderate constipation. 01/07/16   Brunetta Genera, MD    Physical Exam: Vitals:   03/05/19 1422 03/05/19 1600  BP: 112/86 92/66  Pulse: 95 83  Resp: 18 16  Temp: 98.6 F (37 C)   TempSrc: Oral   SpO2: 100% 100%  Constitutional: Extremely deconditioned/debilitated, cachectic Vitals:   03/05/19 1422 03/05/19 1600  BP: 112/86 92/66  Pulse: 95 83  Resp: 18 16  Temp: 98.6 F (37 C)   TempSrc: Oral   SpO2: 100% 100%   Eyes: PERRL, lids and conjunctivae normal, temporal wasting ENMT: Mucous membranes are dry.  Neck: normal, supple, no masses, no thyromegaly Respiratory: clear to auscultation  bilaterally, no wheezing, no crackles. Normal respiratory effort. No accessory muscle use.  Cardiovascular: Regular rate and rhythm, no murmurs / rubs / gallops. No extremity edema. 2+ pedal pulses. No carotid bruits.  Abdomen: Distended, tense abdomen, protruded, focal area of distention and erythema near the epigastric region Musculoskeletal: no clubbing / cyanosis. No joint deformity upper and lower extremities.   Skin: no rashes, lesions, ulcers. No induration  Labs on Admission: I have personally reviewed following labs and imaging studies  CBC: Recent Labs  Lab 03/05/19 1504  WBC 7.7  NEUTROABS 6.9  HGB 6.8*  HCT 23.3*  MCV 78.7*  PLT [id number]*   Basic Metabolic Panel: Recent Labs  Lab 03/05/19 1504  NA 136  K 3.9  CL 102  CO2 25  GLUCOSE 137*  BUN 15  CREATININE 0.64  CALCIUM 9.5   GFR: CrCl cannot be calculated (Unknown ideal weight.). Liver Function Tests: Recent Labs  Lab 03/05/19 1504  AST 14*  ALT 15  ALKPHOS 147*  BILITOT 0.6  PROT 7.7  ALBUMIN 2.3*   Recent Labs  Lab 03/05/19 1504  LIPASE 12   No results for input(s): AMMONIA in the last 168 hours. Coagulation Profile: No results for input(s): INR, PROTIME in the last 168 hours. Cardiac Enzymes: No results for input(s): CKTOTAL, CKMB, CKMBINDEX, TROPONINI in the last 168 hours. BNP (last 3 results) No results for input(s): PROBNP in the last 8760 hours. HbA1C: No results for input(s): HGBA1C in the last 72 hours. CBG: No results for input(s): GLUCAP in the last 168 hours. Lipid Profile: No results for input(s): CHOL, HDL, LDLCALC, TRIG, CHOLHDL, LDLDIRECT in the last 72 hours. Thyroid Function Tests: No results for input(s): TSH, T4TOTAL, FREET4, T3FREE, THYROIDAB in the last 72 hours. Anemia Panel: No results for input(s): VITAMINB12, FOLATE, FERRITIN, TIBC, IRON, RETICCTPCT in the last 72 hours. Urine analysis:    Component Value Date/Time   COLORURINE YELLOW 03/05/2019 1504    APPEARANCEUR CLEAR 03/05/2019 1504   LABSPEC 1.020 03/05/2019 1504   PHURINE 6.0 03/05/2019 1504   GLUCOSEU NEGATIVE 03/05/2019 1504   HGBUR NEGATIVE 03/05/2019 1504   BILIRUBINUR NEGATIVE 03/05/2019 [place] 03/05/2019 1504   PROTEINUR 30 (A) 03/05/2019 1504   NITRITE NEGATIVE 03/05/2019 1504   LEUKOCYTESUR NEGATIVE 03/05/2019 1504    Radiological Exams on Admission: Ct Abdomen Pelvis W Contrast  Result Date: 03/05/2019 CLINICAL DATA:  53 year old male with constipation. History of GI stromal tumor. EXAM: CT ABDOMEN AND PELVIS WITH CONTRAST TECHNIQUE: Multidetector CT imaging of the abdomen and pelvis was performed using the standard protocol following bolus administration of intravenous contrast. CONTRAST:  111mL OMNIPAQUE IOHEXOL 300 MG/ML  SOLN COMPARISON:  07/28/2016 PET CT, 12/15/2015 abdominal/pelvic CT and other studies FINDINGS: Lower chest: No acute abnormality. Hepatobiliary: Multiple new and enlarging hepatic metastases throughout the liver noted. Index LEFT hepatic mass now measures 16.2 x 17.7 cm, previously 11.8 x 17.4 cm at the same level. Hepatic metastatic disease/masses occupy a large portion of the abdomen with compression of other structures. The gallbladder is compressed but without definite abnormality. No definite biliary dilatation.  Pancreas: Severely compressed by large hepatic metastasis without gross abnormality. Spleen: Unremarkable Adrenals/Urinary Tract: The kidneys, adrenal glands and visualized bladder are unremarkable except for a RIGHT renal cyst. Stomach/Bowel: Very ill-defined abnormal soft tissue masses/bowel wall thickening within the pelvis are again noted likely representing this patient's GI stromal tumor/metastatic disease. No dilated bowel loops are noted. Moderate stool in the colon is noted. Bowel loops are severely compressed by large LEFT hepatic/abdominal metastasis. No dilated bowel loops are identified. Vascular/Lymphatic: Aortic  atherosclerosis. No enlarged abdominal or pelvic lymph nodes. Reproductive: No gross abnormality Other: Small amount of ascites is noted. A 12.7 cm fluid-filled structure anterior to the liver mid abdomen may represent focal ascites or possibly fluid within a bowel loop. No evidence of pneumoperitoneum. Musculoskeletal: No acute or suspicious bony abnormality. IMPRESSION: 1. Multiple new and enlarging hepatic metastases with very large dominant LEFT hepatic/abdominal metastasis compressing many structures within the abdomen and UPPER pelvis. 2. Very ill-defined abnormal soft tissue masses/bowel wall thickening within the pelvis likely representing this patient's GI stromal tumor/metastatic disease. Small amount of ascites. 3. 12.7 cm fluid-filled structure anterior to the liver mid abdomen may represent focal ascites or possibly fluid within a bowel loop. No other evidence of bowel obstruction or pneumoperitoneum. 4. Aortic Atherosclerosis (ICD10-I70.0). Electronically Signed   By: Margarette Canada M.D.   On: 03/05/2019 16:51     Assessment/Plan Principal Problem:   Acute blood loss anemia Active Problems:   Iron deficiency anemia   Pelvic mass   Liver mass   Gastrointestinal stromal tumor (GIST) (HCC)   GI bleed   Acute and chronic microcytic anemia: Secondary to GI blood loss secondary to malignancy.  Hemoglobin today 6.8.  FOBT positive.  We will transfuse him with a unit of PRBC.  I do not think he needs further investigation with colonoscopy or EGD.  Continue to monitor CBC.  Metastatic GIST tumor: He was following with Dr. Irene Limbo in 2018 but currently following [facility].  CT abdomen/pelvis finding as above.  Last chemotherapy 6 months ago and it has been stopped due to progression of tumor.  He was previously on imatinib.  Possible underlying abdominal abscess/abdominal wall cellulitis: CT showed 12.7 cm fluid-filled structure anterior to the liver mid abdomen Which might represent  focal ascites  or possibly fluid within a bowel loop.R/O abdominal abscess versus abdominal cellulitis.  I will start on Zosyn for now.  I have requested for IR evaluation though I doubt he is a candidate for any kind of procedure.  Constipation: Not having bowel movement since last 10 days.  Will start on aggressive bowel regimen. we will give him a dose of enema.  History of folate deficiency: Continue supplementation.  Goals of care/end-stage cancer: Needs to discuss goals of care.  I will request for palliative care evaluation.  Remains full code for now.  I discussed with wife.  She said they have discussed with him in the past about goals of care and he wants to be full code.  She has agreed to discuss with palliative care and says she will also discuss with his husband.  Patient is clearly a hospice candidate.  He is being followed by hospice at home now   Severity of Illness: The appropriate patient status for this patient is OBSERVATION.    DVT prophylaxis: SCD Code Status: Full Family Communication: Wife at bed side Consults called: None     Shelly Coss MD [facility] Pager [phone]  If 7PM-7AM, please contact night-coverage [email or web address]  Password [email or web address]  03/05/2019, 5:31 PM

## 2019-03-05 NOTE — Progress Notes (Signed)
Pharmacy Antibiotic Note  Caleb Foley is a 53 y.o. male admitted on 03/05/2019 with IAI.  Pharmacy has been consulted for Zosyn dosing.  Plan: Zosyn 3.375g IV q8 (extended interval infusion) Will sign off     Temp (24hrs), Avg:98.6 F (37 C), Min:98.6 F (37 C), Max:98.6 F (37 C)  Recent Labs  Lab 03/05/19 1504  WBC 7.7  CREATININE 0.64    CrCl cannot be calculated (Unknown ideal weight.).    No Known Allergies   Thank you for allowing pharmacy to be a part of this patient's care.  Kara Mead 03/05/2019 6:00 PM

## 2019-03-05 NOTE — ED Provider Notes (Signed)
[facility] DEPT Provider Note   CSN: [id number] Arrival date & time: 03/05/19  1332     History   Chief Complaint Chief Complaint  Patient presents with  . Constipation    HPI Caleb Foley is a 53 y.o. male.     The history is provided by the patient and medical records. No language interpreter was used.  Constipation    53 year old male with history of gastrointestinal stromal tumor with mets to liver and pelvic, anemia, bradycardia presents ED for evaluation of constipation.  Patient states he feels like he has not had a normal bowel movement for the past several months.  His last bowel movement was 10 days ago.  He is able to pass flatus and denies any nausea or vomiting.  He endorsed diffuse abdominal discomfort described as a tightness achy sensation.  Within the past few days his wife noticed increasing swelling and redness to the anterior aspect of his abdomen that is tender to palpation.  He is currently taking Percocet at home for pain.  He mention no longer receiving chemo treatment from his oncologist at [facility] for at least 6 months as they told him "there is not much more they can do".  He is a full code.  No fever and denies any recent sick contact.  Past Medical History:  Diagnosis Date  . Arthritis   . Chronic back pain   . 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 Umbilical hernia     Patient Active Problem List   Diagnosis Date Noted  . Liver mass   . Liver metastases ([place])   . Gastrointestinal stromal tumor (GIST) ([place])   . Pelvic mass 12/15/2015  . Iron deficiency anemia 05/31/2012  . Anemia 05/16/2012    Past Surgical History:  Procedure Laterality Date  . HERNIA REPAIR  04/30/11   umb hernia  . LUMBAR LAMINECTOMY/DECOMPRESSION MICRODISCECTOMY  06/17/2012   Procedure: LUMBAR LAMINECTOMY/DECOMPRESSION MICRODISCECTOMY 1 LEVEL;  Surgeon: Winfield Cunas, MD;  Location: [facility];  Service: Neurosurgery;  Laterality: Right;   RIGHT Lumbar Four-five diskectomy        Home Medications    Prior to Admission medications   Medication Sig Start Date End Date Taking? Authorizing Provider  folic acid (FOLVITE) 1 MG tablet Take 1 tablet (1 mg total) by mouth daily. 12/21/15   Cherene Altes, MD  imatinib (GLEEVEC) 400 MG tablet Take 1 tablet (400 mg total) by mouth 2 (two) times daily. Take with meals and large glass of water.Caution:Chemotherapy. 06/29/16   Brunetta Genera, MD  senna-docusate (SENNA S) 8.6-50 MG tablet Take 2 tablets by mouth at bedtime as needed for mild constipation or moderate constipation. 01/07/16   Brunetta Genera, MD    Family History No family history on file.  Social History Social History   Tobacco Use  . Smoking status: Former Smoker    Packs/day: 1.00    Years: 5.00    Pack years: 5.00    Types: Cigars    Quit date: 10/17/2015    Years since quitting: 3.3  . Smokeless tobacco: Never Used  . Tobacco comment: smoking 1-2 black n milds a day  occ alcohol weekends   Substance Use Topics  . Alcohol use: Yes    Comment: "social"  . Drug use: No     Allergies   Patient has no known allergies.   Review of Systems Review of Systems  Gastrointestinal: Positive for constipation.  All other systems  reviewed and are negative.    Physical Exam Updated Vital Signs BP 112/86 (BP Location: Left Arm)   Pulse 95   Temp 98.6 F (37 C) (Oral)   Resp 18   SpO2 100%   Physical Exam Vitals signs and nursing note reviewed.  Constitutional:      General: He is not in acute distress.    Appearance: He is well-developed. He is ill-appearing.     Comments: Patient with emaciation appearance, temporal wasting, sunken eyes.  HENT:     Head: Atraumatic.  Eyes:     Conjunctiva/sclera: Conjunctivae normal.  Neck:     Musculoskeletal: Neck supple.  Cardiovascular:     Rate and Rhythm: Normal rate and regular rhythm.     Pulses: Normal pulses.     Heart sounds: Normal  heart sounds.  Pulmonary:     Effort: Pulmonary effort is normal.     Breath sounds: Normal breath sounds. No wheezing, rhonchi or rales.  Abdominal:     General: There is distension.     Comments: Abdomen is distended with lobular appearance suggestive of subcutaneous mass.  There is surrounding skin erythema to the anterior abdomen concerning for potential cellulitis.  Abdomen is tender to palpation, bowel sounds decreased.  Genitourinary:    Comments: Chaperone present during exam.  Normal rectal tone, no obvious mass, no stool impaction, normal color stool on glove Neurological:     General: No focal deficit present.     Mental Status: He is alert.  Psychiatric:        Mood and Affect: Mood normal.      ED Treatments / Results  Labs (all labs ordered are listed, but only abnormal results are displayed) Labs Reviewed - No data to display  EKG None  Radiology No results found.  Procedures .Critical Care Performed by: Domenic Moras, PA-C Authorized by: Domenic Moras, PA-C   Critical care provider statement:    Critical care time (minutes):  45   Critical care was time spent personally by me on the following activities:  Discussions with consultants, evaluation of patient's response to treatment, examination of patient, ordering and performing treatments and interventions, ordering and review of laboratory studies, ordering and review of radiographic studies, pulse oximetry, re-evaluation of patient's condition, obtaining history from patient or surrogate and review of old charts   (including critical care time)  Medications Ordered in ED Medications  sodium chloride (PF) 0.9 % injection (0 mLs  Hold 03/05/19 1617)  0.9 %  sodium chloride infusion (0 mL/hr Intravenous Hold 03/05/19 1700)  sodium chloride 0.9 % bolus 1,000 mL (0 mLs Intravenous Stopped 03/05/19 1626)  morphine 4 MG/ML injection 4 mg (4 mg Intravenous Given 03/05/19 1500)  ondansetron (ZOFRAN) injection 4 mg  (4 mg Intravenous Given 03/05/19 1501)  vancomycin (VANCOCIN) IVPB 1000 mg/200 mL premix (0 mg Intravenous Stopped 03/05/19 1625)  iohexol (OMNIPAQUE) 300 MG/ML solution 100 mL (100 mLs Intravenous Contrast Given 03/05/19 1614)     Initial Impression / Assessment and Plan / ED Course  I have reviewed the triage vital signs and the nursing notes.  Pertinent labs & imaging results that were available during my care of the patient were reviewed by me and considered in my medical decision making (see chart for details).        BP 92/66   Pulse 83   Temp 98.6 F (37 C) (Oral)   Resp 16   SpO2 100%    Final Clinical Impressions(s) / ED  Diagnoses   Final diagnoses:  Gastrointestinal hemorrhage, unspecified gastrointestinal hemorrhage type  Anemia, unspecified type  Constipation, unspecified constipation type    ED Discharge Orders    None     2:50 PM Patient with history of gist tumor with metastasis, currently not receiving oncology treatment, and currently under hospice care.  He is here with constipation and worsening abdominal pain with surrounding skin cellulitis to the anterior abdomen.  Will obtain CT scan of the abdomen and pelvis.  Will provide antibiotic to treat for cellulitis.  COVID-19 testing ordered.  4:29 PM Hgb today is 6.8.  Last measured hgb was 9.7 two years ago.  Will type and screen , check hemoccult and will transfuse.   5:02 PM Through care everywhere, pt was last hospitalized at [facility] on 01/20/2019 for similar complaint.  Hgb of 6.4 at that time, and was transfused 2 units of PRBC.  Bleeding suspect 2/2 to current cancer.  CT scan today shows worsening tumor burden without obvious SBO.    Pt will be admit for blood transfusion, likely source is GIST.  Per prior note, GI have been involved and no evidence of active bleeding from EGD, colonoscopy.    5:33 PM Appreciate consultation from [facility] Dr. Tawanna Solo who agrees to see and admit pt for  further care. Care discussed with DR. Dykstra.   Domenic Moras, PA-C 03/05/19 1737    Lucrezia Starch, MD 03/06/19 [phone]

## 2019-03-06 ENCOUNTER — Observation Stay (HOSPITAL_COMMUNITY)

## 2019-03-06 DIAGNOSIS — C49A3 Gastrointestinal stromal tumor of small intestine: Secondary | ICD-10-CM | POA: Diagnosis present

## 2019-03-06 DIAGNOSIS — Z515 Encounter for palliative care: Secondary | ICD-10-CM

## 2019-03-06 DIAGNOSIS — R1900 Intra-abdominal and pelvic swelling, mass and lump, unspecified site: Secondary | ICD-10-CM | POA: Diagnosis not present

## 2019-03-06 DIAGNOSIS — G893 Neoplasm related pain (acute) (chronic): Secondary | ICD-10-CM | POA: Diagnosis present

## 2019-03-06 DIAGNOSIS — D550 Anemia due to glucose-6-phosphate dehydrogenase [G6PD] deficiency: Secondary | ICD-10-CM | POA: Diagnosis present

## 2019-03-06 DIAGNOSIS — Z87891 Personal history of nicotine dependence: Secondary | ICD-10-CM | POA: Diagnosis not present

## 2019-03-06 DIAGNOSIS — Z9221 Personal history of antineoplastic chemotherapy: Secondary | ICD-10-CM | POA: Diagnosis not present

## 2019-03-06 DIAGNOSIS — K5900 Constipation, unspecified: Secondary | ICD-10-CM | POA: Diagnosis present

## 2019-03-06 DIAGNOSIS — F419 Anxiety disorder, unspecified: Secondary | ICD-10-CM | POA: Diagnosis present

## 2019-03-06 DIAGNOSIS — Z7189 Other specified counseling: Secondary | ICD-10-CM

## 2019-03-06 DIAGNOSIS — D630 Anemia in neoplastic disease: Secondary | ICD-10-CM | POA: Diagnosis present

## 2019-03-06 DIAGNOSIS — E538 Deficiency of other specified B group vitamins: Secondary | ICD-10-CM | POA: Diagnosis present

## 2019-03-06 DIAGNOSIS — L02211 Cutaneous abscess of abdominal wall: Secondary | ICD-10-CM

## 2019-03-06 DIAGNOSIS — Z79891 Long term (current) use of opiate analgesic: Secondary | ICD-10-CM | POA: Diagnosis not present

## 2019-03-06 DIAGNOSIS — Z79899 Other long term (current) drug therapy: Secondary | ICD-10-CM | POA: Diagnosis not present

## 2019-03-06 DIAGNOSIS — D62 Acute posthemorrhagic anemia: Secondary | ICD-10-CM | POA: Diagnosis present

## 2019-03-06 DIAGNOSIS — Z66 Do not resuscitate: Secondary | ICD-10-CM | POA: Diagnosis not present

## 2019-03-06 DIAGNOSIS — R160 Hepatomegaly, not elsewhere classified: Secondary | ICD-10-CM | POA: Diagnosis not present

## 2019-03-06 DIAGNOSIS — C787 Secondary malignant neoplasm of liver and intrahepatic bile duct: Secondary | ICD-10-CM | POA: Diagnosis present

## 2019-03-06 DIAGNOSIS — Z20828 Contact with and (suspected) exposure to other viral communicable diseases: Secondary | ICD-10-CM | POA: Diagnosis present

## 2019-03-06 DIAGNOSIS — K5909 Other constipation: Secondary | ICD-10-CM | POA: Diagnosis present

## 2019-03-06 DIAGNOSIS — C7989 Secondary malignant neoplasm of other specified sites: Secondary | ICD-10-CM | POA: Diagnosis present

## 2019-03-06 DIAGNOSIS — L03311 Cellulitis of abdominal wall: Secondary | ICD-10-CM | POA: Diagnosis present

## 2019-03-06 LAB — BASIC METABOLIC PANEL
Anion gap: 9 (ref 5–15)
BUN: 14 mg/dL (ref 6–20)
CO2: 22 mmol/L (ref 22–32)
Calcium: 9.1 mg/dL (ref 8.9–10.3)
Chloride: 105 mmol/L (ref 98–111)
Creatinine, Ser: 0.56 mg/dL — ABNORMAL LOW (ref 0.61–1.24)
GFR calc Af Amer: >60 mL/min (ref >60)
GFR calc non Af Amer: >60 mL/min (ref >60)
Glucose, Bld: 93 mg/dL (ref 70–99)
Potassium: 3.7 mmol/L (ref 3.5–5.1)
Sodium: 136 mmol/L (ref 135–145)

## 2019-03-06 LAB — CBC
HCT: 24.5 % — ABNORMAL LOW (ref 39.0–52.0)
Hemoglobin: 7.4 g/dL — ABNORMAL LOW (ref 13.0–17.0)
MCH: 23.7 pg — ABNORMAL LOW (ref 26.0–34.0)
MCHC: 30.2 g/dL (ref 30.0–36.0)
MCV: 78.5 fL — ABNORMAL LOW (ref 80.0–100.0)
Platelets: 373 10*3/uL (ref 150–400)
RBC: 3.12 MIL/uL — ABNORMAL LOW (ref 4.22–5.81)
RDW: 18.4 % — ABNORMAL HIGH (ref 11.5–15.5)
WBC: 8.3 10*3/uL (ref 4.0–10.5)
nRBC: 0 % (ref 0.0–0.2)

## 2019-03-06 LAB — BPAM RBC
Blood Product Expiration Date: 202011192359
ISSUE DATE / TIME: 202010182110
Unit Type and Rh: 5100

## 2019-03-06 LAB — TYPE AND SCREEN
ABO/RH(D): O POS
Antibody Screen: NEGATIVE
Unit division: 0

## 2019-03-06 MED ORDER — MIDAZOLAM HCL 2 MG/2ML IJ SOLN
INTRAMUSCULAR | Status: AC | PRN
  Administered 2019-03-06 (×4): 1 mg via INTRAVENOUS

## 2019-03-06 MED ORDER — FENTANYL CITRATE (PF) 100 MCG/2ML IJ SOLN
INTRAMUSCULAR | Status: AC
  Filled 2019-03-06: qty 2

## 2019-03-06 MED ORDER — MIDAZOLAM HCL 2 MG/2ML IJ SOLN
INTRAMUSCULAR | Status: AC
  Filled 2019-03-06: qty 2

## 2019-03-06 MED ORDER — ONDANSETRON HCL 4 MG/2ML IJ SOLN
4.0000 mg | Freq: Four times a day (QID) | INTRAMUSCULAR | Status: DC | PRN
  Administered 2019-03-06: 4 mg via INTRAVENOUS
  Filled 2019-03-06: qty 2

## 2019-03-06 MED ORDER — LIDOCAINE HCL (PF) 1 % IJ SOLN
INTRAMUSCULAR | Status: AC | PRN
  Administered 2019-03-06: 10 mL

## 2019-03-06 MED ORDER — SODIUM CHLORIDE 0.9% FLUSH
5.0000 mL | Freq: Three times a day (TID) | INTRAVENOUS | Status: DC
  Administered 2019-03-06 – 2019-03-07 (×3): 5 mL

## 2019-03-06 MED ORDER — LIDOCAINE HCL 1 % IJ SOLN
INTRAMUSCULAR | Status: AC
  Administered 2019-03-06: 18:00:00
  Filled 2019-03-06: qty 20

## 2019-03-06 MED ORDER — LORAZEPAM 0.5 MG PO TABS
0.5000 mg | ORAL_TABLET | Freq: Three times a day (TID) | ORAL | Status: DC | PRN
  Administered 2019-03-06: 0.5 mg via ORAL
  Filled 2019-03-06 (×2): qty 1

## 2019-03-06 MED ORDER — FENTANYL CITRATE (PF) 100 MCG/2ML IJ SOLN
INTRAMUSCULAR | Status: AC | PRN
  Administered 2019-03-06 (×2): 50 ug via INTRAVENOUS

## 2019-03-06 MED ORDER — ORAL CARE MOUTH RINSE: 15.0000 mL | Freq: Two times a day (BID) | OROMUCOSAL | Status: DC

## 2019-03-06 MED ORDER — MIDAZOLAM HCL 2 MG/2ML IJ SOLN
INTRAMUSCULAR | Status: AC
  Administered 2019-03-06: 18:00:00
  Filled 2019-03-06: qty 2

## 2019-03-06 NOTE — Procedures (Signed)
Interventional Radiology Procedure Note  Procedure: US guided abscess drain placement, 0000000  Complications: None  Estimated Blood Loss: None  Recommendations: - 250 mL aspirated - Cultures sent - Drain to JP - Flush Q shift   Signed,  Criselda Peaches, MD

## 2019-03-06 NOTE — Progress Notes (Signed)
Manufacturing engineer [facility]) Hospital Liaison note.  The [facility] hospital liaison was contacted by Ihor Dow regarding this patient being under our service. In researching our records it appears that this patient is not currently and has never been under [facility] services. He must be a patient with a different hospice provider.  Please call if we can be of further assistance.   Thank you, Farrel Gordon, RN, [facility], (listed on [place]) [phone]

## 2019-03-06 NOTE — Consult Note (Signed)
Consultation Note Date: 03/06/2019   Patient Name: Caleb Foley  DOB: February 14, 1966  MRN: [id number]  Age / Sex: 53 y.o., male  PCP: Patient, No Pcp Per Referring Physician: Shelly Coss, MD  Reason for Consultation: Establishing goals of care  HPI/Patient Profile: 53 y.o. 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 Patient is followed by [facility] oncology for GIST tumor. Chemotherapy has been discontinued due to progression of disease. In ED, CT abdomen/pelvis showed multiple new and enlarging hepatic metastases with very large dominant left hepatic/abdominal metastatic compressing many structures within the abdomen and upper pelvis. Concern for abdominal wall cellulitis vs. Intra abdominal abscess. IR performed US guided drainage with 250cc aspirated and JP drain placed. Cultures pending. Palliative medicine consultation for goals of care.   Clinical Assessment and Goals of Care:  I have reviewed medical records, discussed with care team, and met with patient and wife Margreta Journey) at bedside to discuss goals of care.  Introduced Palliative Medicine as specialized medical care for people living with serious illness. It focuses on providing relief from the symptoms and stress of a serious illness. The goal is to improve quality of life for both the patient and the family.  Oncology notes reviewed. Diagnosed with GIST tumor in 2017. Wife reports chemotherapy was stopped in August 2020 due to progression of disease. He was started on hospice care at this time.  Receives hospice services through [facility]. Patient has DME. Aid visits 3x a week and RN visits 2x a week. (Wife reports in the hallway that this past week, patient refused hospice aids and nurse to visit).   Discussed  events leading up to admission and course of hospital diagnoses. Reviewed CT scan.   Patient is eager to discharge home today---explained that I would discuss with attending.   Discussed patient's symptom management medications. Takes prn oxycodone every 6 hours as needed at home. Takes prn ativan for anxiety. Patient states moderate relief from oxycodone but that he has frequent abdominal pain related to the cancer. (In hallway, wife shares that he under reports his pain. He often takes oxycodone when pain is already severe. He refuses to take prn morphine because of fear that this will lead to end of life.)  Discussed consideration of long-acting opioid to better manage his cancer-related chronic pain and possibly improve his quality of life if pain is better managed. Patient agrees with consideration of long-acting medication for pain. Explained that I would discuss this with his hospice staff so they can follow up on it (with possible d/c tonight or tomorrow).   Discussed code status, for which patient and wife confirm decision for DNR code status. Reiterated this is appropriate moving forward with unfortunate terminal cancer and fear of harm/suffering at EOL with full scope treatment. Explained that having a DNR in place will better facilitate keeping him home with hospice and comfortable at the end of life. Allow  nature to take course.   Patient and wife are happy with current hospice services. They would like to continue hospice services on discharge through [facility]. Again patient is eager to be discharged today.   **Spoke with wife outside of room. She asks about prognosis. Explained eligibility for ongoing hospice support with worsening CT scan. Explained prognosis of likely weeks-months. Educated on EOL expectations and goal of maintaining comfort, quality of life, and dignity at EOL with support of hospice services in the home. Answered all questions and concerns. Hard Choices booklet  given.    **Discussed with attending. It is recommended that patient stay overnight for IR follow-up in AM and also preliminary result on culture that was sent. Patient is not thrilled to hear this but understands the need to stay over night. Updated wife via telephone.    SUMMARY OF RECOMMENDATIONS    Continue medical management inpatient.  Plan is for home with hospice services on discharge. Patient has DME. Patient is followed by [facility] hospice agency. Discussed with hospice liaison.   Symptom management--see below.   Unrestricted visitor access. Allow wife and mother to visit at bedside.   Code Status/Advance Care Planning:  DNR  Symptom Management:   Oxycodone 45m PO q4h prn pain  Ativan 0.532mPO TID prn anxiety  Senna 2 tab PO BID   Miralax daily  Consider long-acting opioid (oxycontin?) to better manage pain and quality of life. Spouse believes he under reports his pain. Patient agreeable to consider long-acting medication. Updated attending and hospice liaison to follow up with this outpatient.   Palliative Prophylaxis:   Bowel Regimen and Frequent Pain Assessment    Psycho-social/Spiritual:   Desire for further Chaplaincy support: yes  Additional Recommendations: Caregiving  Support/Resources, Compassionate Wean Education and Education on Hospice  Prognosis:   Weeks-months: with progression of metastatic GIST tumor  Discharge Planning: Home with Hospice      Primary Diagnoses: Present on Admission: . Iron deficiency anemia . Pelvic mass . Liver mass . Gastrointestinal stromal tumor (GIST) ([place]. GI bleed . GIST (gastrointestinal stromal tumor) of small bowel, malignant ([place]  I have reviewed the medical record, interviewed the patient and family, and examined the patient. The following aspects are pertinent.  Past Medical History:  Diagnosis Date  . Arthritis   . Chronic back pain   . G6PD deficiency anemia ([place]  .  Umbilical hernia    Social History   Socioeconomic History  . Marital status: Single    Spouse name: Not on file  . Number of children: 0  . Years of education: Not on file  . Highest education level: Not on file  Occupational History  . Occupation: [id number]  Comment: Trucking industry-short distances  Social Needs  . Financial resource strain: Not on file  . Food insecurity    Worry: Not on file    Inability: Not on file  . Transportation needs    Medical: Not on file    Non-medical: Not on file  Tobacco Use  . Smoking status: Former Smoker    Packs/day: 1.00    Years: 5.00    Pack years: 5.00    Types: Cigars    Quit date: 10/17/2015    Years since quitting: 3.3  . Smokeless tobacco: Never Used  . Tobacco comment: smoking 1-2 black n milds a day  occ alcohol weekends   Substance and Sexual Activity  . Alcohol use: Yes    Comment: "social"  .  Drug use: No  . Sexual activity: Yes  Lifestyle  . Physical activity    Days per week: Not on file    Minutes per session: Not on file  . Stress: Not on file  Relationships  . Social Herbalist on phone: Not on file    Gets together: Not on file    Attends religious service: Not on file    Active member of club or organization: Not on file    Attends meetings of clubs or organizations: Not on file    Relationship status: Not on file  Other Topics Concern  . Not on file  Social History Narrative   Single, lives alone (has girlfriend)   Works in IT sales professional at night   S/P service in [employer] X 10 years   Has siblings and his mother is in [place]   Enjoys football and basketball   No family history on file. Scheduled Meds: . feeding supplement  1 Container Oral TID BM  . folic acid  1 mg Oral Daily  . lidocaine      . midazolam      . polyethylene glycol  17 g Oral Daily  . senna-docusate  1 tablet Oral BID  . sodium chloride flush  5 mL Intracatheter Q8H  . sodium phosphate  1 enema  Rectal Once   Continuous Infusions: . sodium chloride Stopped (03/05/19 1700)  . sodium chloride 100 mL/hr at 03/06/19 0400  . piperacillin-tazobactam (ZOSYN)  IV 3.375 g (03/06/19 1213)   PRN Meds:.LORazepam, ondansetron (ZOFRAN) IV, oxyCODONE Medications Prior to Admission:  Prior to Admission medications   Medication Sig Start Date End Date Taking? Authorizing Provider  LORazepam (ATIVAN) 0.5 MG tablet Take 0.5 mg by mouth 3 (three) times daily as needed.    Yes [provider]  oxyCODONE (OXY IR/ROXICODONE) 5 MG immediate release tablet Take 5 mg by mouth every 6 (six) hours as needed for pain. 12/12/18  Yes [provider]  Potassium Chloride ER 20 MEQ TBCR Take 20 mEq by mouth daily. 01/22/19  Yes [provider]  folic acid (FOLVITE) 1 MG tablet Take 1 tablet (1 mg total) by mouth daily. Patient not taking: Reported on 03/05/2019 12/21/15   Cherene Altes, MD  imatinib (GLEEVEC) 400 MG tablet Take 1 tablet (400 mg total) by mouth 2 (two) times daily. Take with meals and large glass of water.Caution:Chemotherapy. Patient not taking: Reported on 03/05/2019 06/29/16   Brunetta Genera, MD  senna-docusate (SENNA S) 8.6-50 MG tablet Take 2 tablets by mouth at bedtime as needed for mild constipation or moderate constipation. Patient not taking: Reported on 03/05/2019 01/07/16   Brunetta Genera, MD   No Known Allergies Review of Systems  Constitutional: Positive for appetite change.  Gastrointestinal: Positive for abdominal distention and abdominal pain.   Physical Exam Vitals signs and nursing note reviewed.  Constitutional:      General: He is awake.     Appearance: He is cachectic. He is ill-appearing.  HENT:     Head: Normocephalic and atraumatic.  Pulmonary:     Effort: No tachypnea, accessory muscle usage or respiratory distress.  Abdominal:     General: There is distension.     Tenderness: There is abdominal tenderness.     Comments: JP  drain  Skin:    General: Skin is warm and dry.  Neurological:     Mental Status: He is alert and oriented to person, place, and  time.  Psychiatric:        Mood and Affect: Mood normal.        Speech: Speech normal.        Behavior: Behavior is withdrawn.        Cognition and Memory: Cognition normal.     Vital Signs: BP 100/82 (BP Location: Right Arm)   Pulse 82   Temp 98.9 F (37.2 C) (Oral) Comment: Edited for accuracy  Resp 18   Ht _0  (1.651 m)   Wt 52 kg   SpO2 99%   BMI 19.08 kg/m  Pain Scale: Faces   Pain Score: 0-No pain   SpO2: SpO2: 99 % O2 Device:SpO2: 99 % O2 Flow Rate: .O2 Flow Rate (L/min): 2 L/min  IO: Intake/output summary:   Intake/Output Summary (Last 24 hours) at 03/06/2019 1700 Last data filed at 03/06/2019 1457 Gross per 24 hour  Intake 1575.33 ml  Output 455 ml  Net 1120.33 ml    LBM: Last BM Date: 03/06/19 Baseline Weight: Weight: 52 kg Most recent weight: Weight: 52 kg     Palliative Assessment/Data: PPS 50%   Flowsheet Rows     Most Recent Value  Intake Tab  Referral Department  Hospitalist  Unit at Time of Referral  Med/Surg Unit  Palliative Care Primary Diagnosis  Cancer  Palliative Care Type  New Palliative care  Reason for referral  Clarify Goals of Care  Date first seen by Palliative Care  03/06/19  Clinical Assessment  Palliative Performance Scale Score  50%  Psychosocial & Spiritual Assessment  Palliative Care Outcomes  Patient/Family meeting held?  Yes  Who was at the meeting?  patient and wife  Palliative Care Outcomes  Clarified goals of care, Counseled regarding hospice, Provided end of life care assistance, Provided psychosocial or spiritual support, ACP counseling assistance, Improved pain interventions, Improved non-pain symptom therapy      Time In: 1500  Time Out: 1615 Time Total: 35mn Greater than 50%  of this time was spent counseling and coordinating care related to the above assessment and plan.   Signed by:  MIhor Dow DNP, FNP-C Palliative Medicine Team  Phone: [phone]: [phone]  Please contact Palliative Medicine Team phone at [phone] questions and concerns.  For individual provider: See AShea Evans

## 2019-03-06 NOTE — Plan of Care (Signed)
  Problem: Education: Goal: Knowledge of General Education information will improve Description: Including pain rating scale, medication(s)/side effects and non-pharmacologic comfort measures Outcome: Progressing   Problem: Coping: Goal: Level of anxiety will decrease Outcome: Progressing   

## 2019-03-06 NOTE — Progress Notes (Signed)
PROGRESS NOTE    Caleb Foley  [id number] DOB: May 25, 1965 DOA: 03/05/2019 PCP: Patient, No Pcp Per   Brief Narrative: Caleb Foley is a 53 y.o. 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  Patient has history of metastatic GIST tumor and follows at [facility].  His oncologist has stopped the chemotherapy due to progression of the disease and currently he is not on any treatment.they have noticed the abdomen bulging in size and has turned red.  CT abdomen/pelvis done in the emergency department showed multiple new and enlarging hepatic metastases with very large dominant LEFT hepatic/abdominal metastasis compressing many structures within the abdomen and UPPER pelvis.Also showed  ill-defined abnormal soft tissue masses/bowel wall thickening within the pelvis likely representing this patient's GI stromal tumor/metastatic disease.There is also concern for abdominal wall cellulitis vs intra abdominal abscess.  IR and palliative care consulted.  IR planning for drainage of possible abdominal abscess today.   Assessment & Plan:   Principal Problem:   Acute blood loss anemia Active Problems:   Iron deficiency anemia   Pelvic mass   Liver mass   Gastrointestinal stromal tumor (GIST) (HCC)   GI bleed   GIST (gastrointestinal stromal tumor) of small bowel, malignant (HCC)   Acute and chronic microcytic anemia: Secondary to GI blood loss secondary to malignancy. Hb this morning is 7.4 after he was given a unit of blood yesterday. I do not think he needs further investigation with colonoscopy or EGD.  Continue to monitor CBC.  Metastatic GIST tumor: He was following with Dr. Irene Limbo in 2018 but currently following at [facility].  CT abdomen/pelvis finding as above.  Last chemotherapy 6 months ago and it has been  stopped due to progression of tumor.  He was previously on imatinib.  Possible underlying abdominal abscess/abdominal wall cellulitis: CT showed 12.7 cm fluid-filled structure anterior to the liver mid abdomen which might represent  focal ascites or possibly fluid within a bowel loop.R/O abdominal abscess versus abdominal cellulitis. Continue Zosyn for now.  I have requested for IR evaluation and IR is willing to go ahead and try possible drainage.  Constipation: Not having bowel movement since last 10 days.  Will start on aggressive bowel regimen. we will give him a dose of enema.  History of folate deficiency: Continue supplementation.  Goals of care/end-stage cancer: Now DNR.Needs to discuss goals of care.  I will request for palliative care evaluation. .  I discussed with wife.  She said they have discussed with him in the past about goals of care and he wants to be full code.  She has agreed to discuss with palliative care and says she will also discuss with his husband.  Patient is clearly a hospice candidate.  He is being followed by hospice at home now         DVT prophylaxis:SCD Code Status:DNR Family Communication: Talked to wife on phone Disposition Plan: Hospice house or home with hospice after full work-up   Consultants: IR, palliative care  Procedures: None  Antimicrobials:  Anti-infectives (From admission, onward)   Start     Dose/Rate Route Frequency Ordered Stop   03/06/19 0400  piperacillin-tazobactam (ZOSYN) IVPB 3.375 g     3.375 g 12.5 mL/hr over 240 Minutes Intravenous Every 8 hours 03/05/19 1802     03/05/19 1815  piperacillin-tazobactam (ZOSYN) IVPB  3.375 g     3.375 g 12.5 mL/hr over 240 Minutes Intravenous  Once 03/05/19 1802 03/05/19 2222   03/05/19 1500  vancomycin (VANCOCIN) IVPB 1000 mg/200 mL premix     1,000 mg 200 mL/hr over 60 Minutes Intravenous  Once 03/05/19 1448 03/05/19 1625      Subjective:  Patient seen and examined the bedside  this morning.  Hemodynamically stable.  Still not having a bowel movement.  He denied Fleet enema yesterday .  Waiting for IR procedure today.  Complains of abdominal discomfort but no nausea or vomiting  Objective: Vitals:   03/05/19 2142 03/06/19 0111 03/06/19 0416 03/06/19 1110  BP: 103/74 107/79 94/69 114/83  Pulse:  86 82 76  Resp:  (!) 24 18 18   Temp:  98.4 F (36.9 C) 98.3 F (36.8 C)   TempSrc:  Oral Oral   SpO2:  100% 100% 100%  Weight:      Height:        Intake/Output Summary (Last 24 hours) at 03/06/2019 1118 Last data filed at 03/06/2019 0600 Gross per 24 hour  Intake 1570.33 ml  Output 200 ml  Net 1370.33 ml   Filed Weights   03/05/19 1950  Weight: 52 kg    Examination:  General exam: Extremely deconditioned/debilitated, cachectic  HEENT: Temporal wasting Respiratory system: Bilateral equal air entry, normal vesicular breath sounds, no wheezes or crackles  Cardiovascular system: S1 & S2 heard, RRR. No JVD, murmurs, rubs, gallops or clicks. No pedal edema. Gastrointestinal system: Abdomen is distended, tense, has generalized tenderness, protruding mass from inside with erythematous changes on the skin and superficial skin ulceration  Central nervous system: Alert and oriented. No focal neurological deficits. Extremities: No edema, no clubbing ,no cyanosis, distal peripheral pulses palpable. Skin: No rashes, lesions or ulcers,no icterus ,no pallor     Data Reviewed: I have personally reviewed following labs and imaging studies  CBC: Recent Labs  Lab 03/05/19 1504 03/06/19 0448  WBC 7.7 8.3  NEUTROABS 6.9  --   HGB 6.8* 7.4*  HCT 23.3* 24.5*  MCV 78.7* 78.5*  PLT 463* [id number]   Basic Metabolic Panel: Recent Labs  Lab 03/05/19 1504 03/06/19 0448  NA 136 136  K 3.9 3.7  CL 102 105  CO2 25 22  GLUCOSE 137* 93  BUN 15 14  CREATININE 0.64 0.56*  CALCIUM 9.5 9.1   GFR: Estimated Creatinine Clearance: 80.3 mL/min (A) (by C-G formula based on SCr  of 0.56 mg/dL (L)). Liver Function Tests: Recent Labs  Lab 03/05/19 1504  AST 14*  ALT 15  ALKPHOS 147*  BILITOT 0.6  PROT 7.7  ALBUMIN 2.3*   Recent Labs  Lab 03/05/19 1504  LIPASE 12   No results for input(s): AMMONIA in the last 168 hours. Coagulation Profile: No results for input(s): INR, PROTIME in the last 168 hours. Cardiac Enzymes: No results for input(s): CKTOTAL, CKMB, CKMBINDEX, TROPONINI in the last 168 hours. BNP (last 3 results) No results for input(s): PROBNP in the last 8760 hours. HbA1C: No results for input(s): HGBA1C in the last 72 hours. CBG: No results for input(s): GLUCAP in the last 168 hours. Lipid Profile: No results for input(s): CHOL, HDL, LDLCALC, TRIG, CHOLHDL, LDLDIRECT in the last 72 hours. Thyroid Function Tests: No results for input(s): TSH, T4TOTAL, FREET4, T3FREE, THYROIDAB in the last 72 hours. Anemia Panel: No results for input(s): VITAMINB12, FOLATE, FERRITIN, TIBC, IRON, RETICCTPCT in the last 72 hours. Sepsis Labs: No results for input(s): PROCALCITON,  LATICACIDVEN in the last 168 hours.  Recent Results (from the past 240 hour(s))  SARS CORONAVIRUS 2 (TAT 6-24 HRS) Nasopharyngeal Nasopharyngeal Swab     Status: None   Collection Time: 03/05/19  4:55 PM   Specimen: Nasopharyngeal Swab  Result Value Ref Range Status   SARS Coronavirus 2 NEGATIVE NEGATIVE Final    Comment: (NOTE) SARS-CoV-2 target nucleic acids are NOT DETECTED. The SARS-CoV-2 RNA is generally detectable in upper and lower respiratory specimens during the acute phase of infection. Negative results do not preclude SARS-CoV-2 infection, do not rule out co-infections with other pathogens, and should not be used as the sole basis for treatment or other patient management decisions. Negative results must be combined with clinical observations, patient history, and epidemiological information. The expected result is Negative. Fact Sheet for Patients:  SugarRoll.be Fact Sheet for Healthcare Providers: https://www.woods-mathews.com/ This test is not yet approved or cleared by the [place] FDA and  has been authorized for detection and/or diagnosis of SARS-CoV-2 by FDA under an Emergency Use Authorization (EUA). This EUA will remain  in effect (meaning this test can be used) for the duration of the COVID-19 declaration under Section 56 4(b)(1) of the Act, 21 U.S.C. section 360bbb-3(b)(1), unless the authorization is terminated or revoked sooner. Performed at [facility], [place] [address] [place],  [address]          Radiology Studies: Ct Abdomen Pelvis W Contrast  Result Date: 03/05/2019 CLINICAL DATA:  53 year old male with constipation. History of GI stromal tumor. EXAM: CT ABDOMEN AND PELVIS WITH CONTRAST TECHNIQUE: Multidetector CT imaging of the abdomen and pelvis was performed using the standard protocol following bolus administration of intravenous contrast. CONTRAST:  168mL OMNIPAQUE IOHEXOL 300 MG/ML  SOLN COMPARISON:  07/28/2016 PET CT, 12/15/2015 abdominal/pelvic CT and other studies FINDINGS: Lower chest: No acute abnormality. Hepatobiliary: Multiple new and enlarging hepatic metastases throughout the liver noted. Index LEFT hepatic mass now measures 16.2 x 17.7 cm, previously 11.8 x 17.4 cm at the same level. Hepatic metastatic disease/masses occupy a large portion of the abdomen with compression of other structures. The gallbladder is compressed but without definite abnormality. No definite biliary dilatation. Pancreas: Severely compressed by large hepatic metastasis without gross abnormality. Spleen: Unremarkable Adrenals/Urinary Tract: The kidneys, adrenal glands and visualized bladder are unremarkable except for a RIGHT renal cyst. Stomach/Bowel: Very ill-defined abnormal soft tissue masses/bowel wall thickening within the pelvis are again noted likely representing  this patient's GI stromal tumor/metastatic disease. No dilated bowel loops are noted. Moderate stool in the colon is noted. Bowel loops are severely compressed by large LEFT hepatic/abdominal metastasis. No dilated bowel loops are identified. Vascular/Lymphatic: Aortic atherosclerosis. No enlarged abdominal or pelvic lymph nodes. Reproductive: No gross abnormality Other: Small amount of ascites is noted. A 12.7 cm fluid-filled structure anterior to the liver mid abdomen may represent focal ascites or possibly fluid within a bowel loop. No evidence of pneumoperitoneum. Musculoskeletal: No acute or suspicious bony abnormality. IMPRESSION: 1. Multiple new and enlarging hepatic metastases with very large dominant LEFT hepatic/abdominal metastasis compressing many structures within the abdomen and UPPER pelvis. 2. Very ill-defined abnormal soft tissue masses/bowel wall thickening within the pelvis likely representing this patient's GI stromal tumor/metastatic disease. Small amount of ascites. 3. 12.7 cm fluid-filled structure anterior to the liver mid abdomen may represent focal ascites or possibly fluid within a bowel loop. No other evidence of bowel obstruction or pneumoperitoneum. 4. Aortic Atherosclerosis (ICD10-I70.0). Electronically Signed   By: Dellis Filbert  Hu M.D.   On: 03/05/2019 16:51        Scheduled Meds: . feeding supplement  1 Container Oral TID BM  . fentaNYL      . folic acid  1 mg Oral Daily  . lidocaine      . polyethylene glycol  17 g Oral Daily  . senna-docusate  1 tablet Oral BID  . sodium phosphate  1 enema Rectal Once   Continuous Infusions: . sodium chloride Stopped (03/05/19 1700)  . sodium chloride 100 mL/hr at 03/06/19 0400  . piperacillin-tazobactam (ZOSYN)  IV 3.375 g (03/06/19 0456)     LOS: 0 days    Time spent: 25 mins.More than 50% of that time was spent in counseling and/or coordination of care.      Shelly Coss, MD [facility] Pager [phone]   If 7PM-7AM, please contact night-coverage [email or web address] Password [email or web address] 03/06/2019, 11:18 AM

## 2019-03-06 NOTE — Consult Note (Signed)
Chief Complaint: Patient was seen in consultation today for abdominal wall abscess  Referring Physician(s): Dr. Tawanna Solo  Supervising Physician: Jacqulynn Cadet  Patient Status: [facility] - In-pt  History of Present Illness: Caleb Foley is a 53 y.o. male with past medical history of GIST tumor and liver metastasis presented to the [facility] ED with abdominal pain and constipation.  He reports the pain is new to him and related to an area of growth on the right side of his abdomen which has continued to enlarge over the past few days.   CT Abdomen Pelvis 03/05/19 showed: 1. Multiple new and enlarging hepatic metastases with very large dominant LEFT hepatic/abdominal metastasis compressing many structures within the abdomen and UPPER pelvis. 2. Very ill-defined abnormal soft tissue masses/bowel wall thickening within the pelvis likely representing this patient's GI stromal tumor/metastatic disease. Small amount of ascites. 3. 12.7 cm fluid-filled structure anterior to the liver mid abdomen may represent focal ascites or possibly fluid within a bowel loop. No other evidence of bowel obstruction or pneumoperitoneum. 4. Aortic Atherosclerosis (ICD10-I70.0).  IR consulted for aspiration and drainage of the abdominal wall fluid collection.   Patient has been NPO today.  He does not take blood thinners.   Past Medical History:  Diagnosis Date   Arthritis    Chronic back pain    G6PD deficiency anemia ([place])    Umbilical hernia     Past Surgical History:  Procedure Laterality Date   HERNIA REPAIR  04/30/11   umb hernia   LUMBAR LAMINECTOMY/DECOMPRESSION MICRODISCECTOMY  06/17/2012   Procedure: LUMBAR LAMINECTOMY/DECOMPRESSION MICRODISCECTOMY 1 LEVEL;  Surgeon: Winfield Cunas, MD;  Location: [facility];  Service: Neurosurgery;  Laterality: Right;  RIGHT Lumbar Four-five diskectomy    Allergies: Patient has no known allergies.  Medications: Prior to Admission medications    Medication Sig Start Date End Date Taking? Authorizing Provider  LORazepam (ATIVAN) 0.5 MG tablet Take 0.5 mg by mouth 3 (three) times daily as needed.    Yes [provider]  oxyCODONE (OXY IR/ROXICODONE) 5 MG immediate release tablet Take 5 mg by mouth every 6 (six) hours as needed for pain. 12/12/18  Yes [provider]  Potassium Chloride ER 20 MEQ TBCR Take 20 mEq by mouth daily. 01/22/19  Yes [provider]  folic acid (FOLVITE) 1 MG tablet Take 1 tablet (1 mg total) by mouth daily. Patient not taking: Reported on 03/05/2019 12/21/15   Cherene Altes, MD  imatinib (GLEEVEC) 400 MG tablet Take 1 tablet (400 mg total) by mouth 2 (two) times daily. Take with meals and large glass of water.Caution:Chemotherapy. Patient not taking: Reported on 03/05/2019 06/29/16   Brunetta Genera, MD  senna-docusate (SENNA S) 8.6-50 MG tablet Take 2 tablets by mouth at bedtime as needed for mild constipation or moderate constipation. Patient not taking: Reported on 03/05/2019 01/07/16   Brunetta Genera, MD     No family history on file.  Social History   Socioeconomic History   Marital status: Single    Spouse name: Not on file   Number of children: 0   Years of education: Not on file   Highest education level: Not on file  Occupational History   Occupation: [id number]    Comment: Trucking industry-short distances  Social Needs   Financial resource strain: Not on file   Food insecurity    Worry: Not on file    Inability: Not on file   Transportation needs    Medical: Not  on file    Non-medical: Not on file  Tobacco Use   Smoking status: Former Smoker    Packs/day: 1.00    Years: 5.00    Pack years: 5.00    Types: Cigars    Quit date: 10/17/2015    Years since quitting: 3.3   Smokeless tobacco: Never Used   Tobacco comment: smoking 1-2 black n milds a day  occ alcohol weekends   Substance and Sexual Activity   Alcohol use: Yes    Comment:  "social"   Drug use: No   Sexual activity: Yes  Lifestyle   Physical activity    Days per week: Not on file    Minutes per session: Not on file   Stress: Not on file  Relationships   Social connections    Talks on phone: Not on file    Gets together: Not on file    Attends religious service: Not on file    Active member of club or organization: Not on file    Attends meetings of clubs or organizations: Not on file    Relationship status: Not on file  Other Topics Concern   Not on file  Social History Narrative   Single, lives alone (has girlfriend)   Works in IT sales professional at night   S/P service in [employer] X 10 years   Has siblings and his mother is in [place]   Enjoys football and basketball     Review of Systems: A 12 point ROS discussed and pertinent positives are indicated in the HPI above.  All other systems are negative.  Review of Systems  Constitutional: Negative for fatigue and fever.  Respiratory: Negative for cough and shortness of breath.   Cardiovascular: Negative for chest pain.  Gastrointestinal: Positive for abdominal distention, abdominal pain, nausea and vomiting.  Musculoskeletal: Negative for back pain.  Psychiatric/Behavioral: Negative for behavioral problems and confusion.    Vital Signs: BP 94/69 (BP Location: Right Arm)    Pulse 82    Temp 98.3 F (36.8 C) (Oral)    Resp 18    Ht 5\' 5"  (1.651 m)    Wt 114 lb 10.2 oz (52 kg)    SpO2 100%    BMI 19.08 kg/m   Physical Exam Vitals signs and nursing note reviewed.  Constitutional:      Appearance: Normal appearance.  HENT:     Mouth/Throat:     Mouth: Mucous membranes are moist.     Pharynx: Oropharynx is clear.  Cardiovascular:     Rate and Rhythm: Normal rate and regular rhythm.  Pulmonary:     Effort: Pulmonary effort is normal. No respiratory distress.     Breath sounds: Normal breath sounds.  Abdominal:     General: Abdomen is flat. There is distension.      Palpations: Abdomen is soft. There is mass.     Comments: Area of well-circumscribed swelling and tenderness in the RUQ.  There is erythema.  This is a small open wound centrally which is not actively draining.  With the patient's permission, a picture is saved to his chart.  Skin:    General: Skin is warm and dry.  Neurological:     General: No focal deficit present.     Mental Status: He is alert and oriented to person, place, and time.  Psychiatric:        Mood and Affect: Mood normal.        Behavior: Behavior normal.  Thought Content: Thought content normal.        Judgment: Judgment normal.    Media Information    Document Information  Photos    03/06/2019 10:45  Attached To:  Hospital Encounter on 03/05/19  Source Information  Zigmund Daniel, Herma Carson, PA   Wl-4 Lupita Leash Telemetry     MD Evaluation Airway: WNL Heart: WNL Abdomen: WNL Chest/ Lungs: WNL ASA  Classification: 3 Mallampati/Airway Score: One   Imaging: Ct Abdomen Pelvis W Contrast  Result Date: 03/05/2019 CLINICAL DATA:  53 year old male with constipation. History of GI stromal tumor. EXAM: CT ABDOMEN AND PELVIS WITH CONTRAST TECHNIQUE: Multidetector CT imaging of the abdomen and pelvis was performed using the standard protocol following bolus administration of intravenous contrast. CONTRAST:  189mL OMNIPAQUE IOHEXOL 300 MG/ML  SOLN COMPARISON:  07/28/2016 PET CT, 12/15/2015 abdominal/pelvic CT and other studies FINDINGS: Lower chest: No acute abnormality. Hepatobiliary: Multiple new and enlarging hepatic metastases throughout the liver noted. Index LEFT hepatic mass now measures 16.2 x 17.7 cm, previously 11.8 x 17.4 cm at the same level. Hepatic metastatic disease/masses occupy a large portion of the abdomen with compression of other structures. The gallbladder is compressed but without definite abnormality. No definite biliary dilatation. Pancreas: Severely compressed by large hepatic metastasis  without gross abnormality. Spleen: Unremarkable Adrenals/Urinary Tract: The kidneys, adrenal glands and visualized bladder are unremarkable except for a RIGHT renal cyst. Stomach/Bowel: Very ill-defined abnormal soft tissue masses/bowel wall thickening within the pelvis are again noted likely representing this patient's GI stromal tumor/metastatic disease. No dilated bowel loops are noted. Moderate stool in the colon is noted. Bowel loops are severely compressed by large LEFT hepatic/abdominal metastasis. No dilated bowel loops are identified. Vascular/Lymphatic: Aortic atherosclerosis. No enlarged abdominal or pelvic lymph nodes. Reproductive: No gross abnormality Other: Small amount of ascites is noted. A 12.7 cm fluid-filled structure anterior to the liver mid abdomen may represent focal ascites or possibly fluid within a bowel loop. No evidence of pneumoperitoneum. Musculoskeletal: No acute or suspicious bony abnormality. IMPRESSION: 1. Multiple new and enlarging hepatic metastases with very large dominant LEFT hepatic/abdominal metastasis compressing many structures within the abdomen and UPPER pelvis. 2. Very ill-defined abnormal soft tissue masses/bowel wall thickening within the pelvis likely representing this patient's GI stromal tumor/metastatic disease. Small amount of ascites. 3. 12.7 cm fluid-filled structure anterior to the liver mid abdomen may represent focal ascites or possibly fluid within a bowel loop. No other evidence of bowel obstruction or pneumoperitoneum. 4. Aortic Atherosclerosis (ICD10-I70.0). Electronically Signed   By: Margarette Canada M.D.   On: 03/05/2019 16:51    Labs:  CBC: Recent Labs    03/05/19 1504 03/06/19 0448  WBC 7.7 8.3  HGB 6.8* 7.4*  HCT 23.3* 24.5*  PLT 463* 373    COAGS: No results for input(s): INR, APTT in the last 8760 hours.  BMP: Recent Labs    03/05/19 1504 03/06/19 0448  NA 136 136  K 3.9 3.7  CL 102 105  CO2 25 22  GLUCOSE 137* 93  BUN 15  14  CALCIUM 9.5 9.1  CREATININE 0.64 0.56*  GFRNONAA >60 >60  GFRAA >60 >60    LIVER FUNCTION TESTS: Recent Labs    03/05/19 1504  BILITOT 0.6  AST 14*  ALT 15  ALKPHOS 147*  PROT 7.7  ALBUMIN 2.3*    TUMOR MARKERS: No results for input(s): AFPTM, CEA, CA199, CHROMGRNA in the last 8760 hours.  Assessment and Plan: Abdominal wall fluid collection  Caleb Foley  has a history of large GIST tumor and multiple liver metastasis.  He presented to [facility] ED with abdominal pain and constipation.  He reports he has new pain related to a growing wound/collection on his abdomen.   CT shows an abdominal wall fluid collection, possible abscess.  He has been started on Zosyn.  IR consulted for aspiration and drainage.  Dr. Laurence Ferrari has reviewed and approved patient for this procedure.  He has been NPO.   Risks and benefits discussed with the patient including bleeding, infection, damage to adjacent structures, bowel perforation/fistula connection, and sepsis.  All of the patient's questions were answered, patient is agreeable to proceed. Consent signed and in chart.  Thank you for this interesting consult.  I greatly enjoyed meeting Caleb Foley and look forward to participating in their care.  A copy of this report was sent to the requesting provider on this date.  Electronically Signed: Docia Barrier, PA 03/06/2019, 10:59 AM   I spent a total of 40 Minutes    in face to face in clinical consultation, greater than 50% of which was counseling/coordinating care for abdominal wall fluid collection.

## 2019-03-06 NOTE — Progress Notes (Signed)
Patient and his wife refused enema, wife states she feels patient was confused when he reported no BM x 10 days, states she will give him laxatives at home as needed, small BM this morning noted

## 2019-03-06 NOTE — TOC Progression Note (Signed)
Transition of Care [facility]) - Progression Note    Patient Details  Name: Caleb Foley MRN: [id number] Date of Birth: Oct 16, 1965  [facility] CM/SW Contact  Mykle Pascua, Juliann Pulse, RN Phone Number: 03/06/2019, 4:33 PM  Clinical Narrative:From home w/[facility]-d/c plan return back home w/[facility]. Has all dme.            Expected Discharge Plan and Services                                                 Social Determinants of Health (SDOH) Interventions    Readmission Risk Interventions No flowsheet data found.

## 2019-03-07 LAB — CBC WITH DIFFERENTIAL/PLATELET
Abs Immature Granulocytes: 0.03 10*3/uL (ref 0.00–0.07)
Basophils Absolute: 0 10*3/uL (ref 0.0–0.1)
Basophils Relative: 0 %
Eosinophils Absolute: 0 10*3/uL (ref 0.0–0.5)
Eosinophils Relative: 1 %
HCT: 24.5 % — ABNORMAL LOW (ref 39.0–52.0)
Hemoglobin: 7 g/dL — ABNORMAL LOW (ref 13.0–17.0)
Immature Granulocytes: 1 %
Lymphocytes Relative: 6 %
Lymphs Abs: 0.4 10*3/uL — ABNORMAL LOW (ref 0.7–4.0)
MCH: 24 pg — ABNORMAL LOW (ref 26.0–34.0)
MCHC: 28.6 g/dL — ABNORMAL LOW (ref 30.0–36.0)
MCV: 83.9 fL (ref 80.0–100.0)
Monocytes Absolute: 0.5 10*3/uL (ref 0.1–1.0)
Monocytes Relative: 8 %
Neutro Abs: 5.2 10*3/uL (ref 1.7–7.7)
Neutrophils Relative %: 84 %
Platelets: 333 10*3/uL (ref 150–400)
RBC: 2.92 MIL/uL — ABNORMAL LOW (ref 4.22–5.81)
RDW: 18.6 % — ABNORMAL HIGH (ref 11.5–15.5)
WBC: 6.1 10*3/uL (ref 4.0–10.5)
nRBC: 0 % (ref 0.0–0.2)

## 2019-03-07 MED ORDER — LORAZEPAM 0.5 MG PO TABS: 0.5000 mg | ORAL_TABLET | Freq: Three times a day (TID) | ORAL | 0 refills | Status: AC | PRN

## 2019-03-07 MED ORDER — POLYETHYLENE GLYCOL 3350 17 G PO PACK: 17.0000 g | PACK | Freq: Every day | ORAL | 0 refills | Status: AC

## 2019-03-07 MED ORDER — SENNOSIDES-DOCUSATE SODIUM 8.6-50 MG PO TABS: 1.0000 | ORAL_TABLET | Freq: Two times a day (BID) | ORAL | 0 refills | Status: AC

## 2019-03-07 MED ORDER — OXYCODONE HCL 5 MG PO TABS: 5.0000 mg | ORAL_TABLET | Freq: Four times a day (QID) | ORAL | 0 refills | Status: AC | PRN

## 2019-03-07 MED ORDER — AMOXICILLIN-POT CLAVULANATE 875-125 MG PO TABS: 1.0000 | ORAL_TABLET | Freq: Two times a day (BID) | ORAL | 0 refills | Status: AC

## 2019-03-07 NOTE — Progress Notes (Signed)
Daily Progress Note   Patient Name: Caleb Foley       Date: 03/07/2019 DOB: 1965/11/07  Age: 53 y.o. MRN#: [id number] Attending Physician: Shelly Coss, MD Primary Care Physician: Patient, No Pcp Per Admit Date: 03/05/2019  Reason for Consultation/Follow-up: Establishing goals of care  Subjective/GOC:  Received call from wife, Caleb Foley this AM. Answered questions and concerns and updated on plan of care and likely discharge this afternoon. Christina requests I call the patient's mother, Caleb Foley.  Spoke with Caleb Foley via telephone to update on course of hospitalization including diagnoses, interventions, and plan of care. Frankly and compassionately explained poor long-term prognosis with cancer progression. Educated on hospice services and philosophy. Answered all questions and concerns.    Length of Stay: 1  Current Medications: Scheduled Meds:  . feeding supplement  1 Container Oral TID BM  . folic acid  1 mg Oral Daily  . mouth rinse  15 mL Mouth Rinse BID  . polyethylene glycol  17 g Oral Daily  . senna-docusate  1 tablet Oral BID  . sodium chloride flush  5 mL Intracatheter Q8H  . sodium phosphate  1 enema Rectal Once    Continuous Infusions: . sodium chloride Stopped (03/05/19 1700)  . sodium chloride Stopped (03/07/19 0428)  . piperacillin-tazobactam (ZOSYN)  IV 3.375 g (03/07/19 0428)    PRN Meds: LORazepam, ondansetron (ZOFRAN) IV, oxyCODONE  Physical Exam          Vital Signs: BP 98/74 (BP Location: Left Arm)   Pulse 88   Temp 98.1 F (36.7 C) (Oral)   Resp 14   Ht 5\' 5"  (1.651 m)   Wt 52 kg   SpO2 100%   BMI 19.08 kg/m  SpO2: SpO2: 100 % O2 Device: O2 Device: Room Air O2 Flow Rate: O2 Flow Rate (L/min): 2 L/min  Intake/output  summary:   Intake/Output Summary (Last 24 hours) at 03/07/2019 0914 Last data filed at 03/07/2019 0600 Gross per 24 hour  Intake 1687.53 ml  Output 265 ml  Net 1422.53 ml   LBM: Last BM Date: 03/06/19 Baseline Weight: Weight: 52 kg Most recent weight: Weight: 52 kg       Palliative Assessment/Data: PPS 50%    Flowsheet Rows     Most Recent Value  Intake Tab  Referral Department  Hospitalist  Unit at Time of Referral  Med/Surg Unit  Palliative Care Primary Diagnosis  Cancer  Palliative Care Type  New Palliative care  Reason for referral  Clarify Goals of Care  Date first seen by Palliative Care  03/06/19  Clinical Assessment  Palliative Performance Scale Score  50%  Psychosocial & Spiritual Assessment  Palliative Care Outcomes  Patient/Family meeting held?  Yes  Who was at the meeting?  patient and wife  Palliative Care Outcomes  Clarified goals of care, Counseled regarding hospice, Provided end of life care assistance, Provided psychosocial or spiritual support, ACP counseling assistance, Improved pain interventions, Improved non-pain symptom therapy      Patient Active Problem List   Diagnosis Date Noted  . GIST (gastrointestinal stromal tumor) of small bowel, malignant ([place]) 03/06/2019  . Palliative care by specialist   . Goals of care, counseling/discussion   . Cancer related pain   . Abdominal wall abscess   . Acute blood loss anemia 03/05/2019  . GI bleed 03/05/2019  . Liver mass   . Gastrointestinal stromal tumor (GIST) ([place])   . Pelvic mass 12/15/2015  . Iron deficiency anemia 05/31/2012  . Anemia 05/16/2012    Palliative Care Assessment & Plan   Patient Profile: 53 y.o. 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 Patient is followed by [facility] oncology for GIST tumor. Chemotherapy has been discontinued due to  progression of disease. In ED, CT abdomen/pelvis showed multiple new and enlarging hepatic metastases with very large dominant left hepatic/abdominal metastatic compressing many structures within the abdomen and upper pelvis. Concern for abdominal wall cellulitis vs. Intra abdominal abscess. IR performed US guided drainage with 250cc aspirated and JP drain placed. Cultures pending. Palliative medicine consultation for goals of care.   Assessment: Metastatic GIST tumor with worsening CT Underlying abdominal abscess/abdominal wall cellulitis Acute on chronic anemia Constipation Cancer-related pain  Recommendations/Plan:  Continue medical management inpatient.  Plan is for home hospice services on discharge. Patient currently followed by home hospice services. He has DME.   Recommend hospice agency consider long-acting opioid to better manage patient's cancer-related pain and quality of life. Continue prn ativan.   Likely discharge home today.   Code Status: DNR   Code Status Orders  (From admission, onward)         Start     Ordered   03/06/19 0816  Do not attempt resuscitation (DNR)  Continuous    Question Answer Comment  In the event of cardiac or respiratory ARREST Do not call a "code blue"   In the event of cardiac or respiratory ARREST Do not perform Intubation, CPR, defibrillation or ACLS   In the event of cardiac or respiratory ARREST Use medication by any route, position, wound care, and other measures to relive pain and suffering. May use oxygen, suction and manual treatment of airway obstruction as needed for comfort.      03/06/19 0815        Code Status History    Date Active Date Inactive Code Status Order ID Comments User Context   03/05/2019 1730 03/06/2019 0815 Full Code [id number]  Shelly Coss, MD ED   12/15/2015 2049 12/21/2015 2152 Full Code [id number]  Reubin Milan, MD Inpatient   Advance Care Planning Activity    Advance Directive Documentation      Most Recent Value  Type of Advance Directive  Living will  Pre-existing out of facility DNR order (yellow form or pink MOST form)  -  "MOST" Form in Place?  -       Prognosis:   Poor prognosis  Discharge Planning:  Home with Hospice  Care plan was discussed with wife, mother  Thank you for allowing the Palliative Medicine Team to assist in the care of this patient.   Time In: 0900 Time Out: 0915 Total Time 15 Prolonged Time Billed  no      Greater than 50%  of this time was spent counseling and coordinating care related to the above assessment and plan.  Ihor Dow, DNP, FNP-C Palliative Medicine Team  Phone: [phone] Fax: [phone]  Please contact Palliative Medicine Team phone at [phone] for questions and concerns.

## 2019-03-07 NOTE — TOC Transition Note (Signed)
Transition of Care [facility]) - CM/SW Discharge Note   Patient Details  Name: Caleb Foley MRN: [id number] Date of Birth: [birth year]  [facility] CM/SW Contact:  Dessa Phi, RN Phone Number: 03/07/2019, 12:51 PM   Clinical Narrative: Patient active w/[facility]-TC rep Caryl Pina informed of d/c-they will contact patient @ home for home visit.            Patient Goals and CMS Choice        Discharge Placement                       Discharge Plan and Services                                     Social Determinants of Health (SDOH) Interventions     Readmission Risk Interventions No flowsheet data found.

## 2019-03-07 NOTE — Discharge Summary (Signed)
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 Patient has history of metastatic GIST tumor and follows at [facility]. His oncologist hasstopped the chemotherapy due to progression of the disease and currently he is not on any treatment.they have noticed the abdomen bulging in size and has turned red. CT abdomen/pelvis done in the emergency department showedmultiple new and enlarging hepatic metastases with very large dominant LEFT hepatic/abdominal metastasis compressing many structures within the abdomen and UPPER pelvis.Alsoshowed ill-defined abnormal soft tissue masses/bowel wall thickening within the pelvis likely representing this patient's GI stromal tumor/metastatic disease.There is also concern for abdominal wall cellulitis vs intra abdominal abscess.  IR and palliative care consulted.  IR drained the abdomen abscess and put a drain.  Aerobic/anaerobic cultures were multiple organisms.  Patient desparately wants to go home.  He will be followed by hospice at home.  He is hemodynamically stable for discharge today with oral antibiotics.  Following problems were addressed during his hospitalization:  Acute and chronic microcytic anemia: Secondary to GI blood loss secondary to malignancy. S/p a unit of blood .I do not think he needs further investigation with colonoscopy or EGD. Hb in the range of 7.  Metastatic GIST tumor:He was following with Dr. Irene Limbo in 2018 but  currently following at [facility]. CT abdomen/pelvis finding as above. Last chemotherapy 6 months ago and it has been stopped due to progression of tumor. He was previously on imatinib.  Possible underlying abdominal abscess/abdominal wall cellulitis:CT showed12.7 cm fluid-filled structure anterior to the liver mid abdomen which might representfocal ascites or possibly fluid within a bowel loop.IR drained the abdomen abscess and put a drain.  Aerobic/anaerobic cultures were multiple organisms.Continue oral antibiotics on DC.  Constipation: Not having bowel movement since last 10 days. Will start on aggressive bowel regimen.  Goals of care/end-stage cancer:Now DNR.  Perative care was following.  He will be followed by hospice at home.   Discharge Diagnoses:  Principal Problem:   Acute blood loss anemia Active Problems:   Iron deficiency anemia   Pelvic mass   Liver mass   Gastrointestinal stromal tumor (GIST) (HCC)   GI bleed   GIST (gastrointestinal stromal tumor) of small bowel, malignant ([place])   Palliative care by specialist   Goals of care, counseling/discussion   Cancer related pain   Abdominal wall abscess    Discharge Instructions  Discharge Instructions    Diet - low sodium heart healthy   Complete by: As directed    Discharge instructions   Complete by: As directed    1)Please follow up with hospice. 2)Take prescribed medications as instructed. 3)You will be called by IR for follow up appointment regarding the abdominal drain.   Increase activity slowly   Complete by: As directed      Allergies as of 03/07/2019   No Known Allergies     Medication List    STOP taking these medications   folic acid 1 MG tablet Commonly known as: FOLVITE   imatinib 400 MG tablet Commonly known as: GLEEVEC     TAKE these medications   amoxicillin-clavulanate  875-125 MG tablet Commonly known as: Augmentin Take 1 tablet by mouth 2 (two) times daily for 11 days.    LORazepam 0.5 MG tablet Commonly known as: ATIVAN Take 1 tablet (0.5 mg total) by mouth 3 (three) times daily as needed.   oxyCODONE 5 MG immediate release tablet Commonly known as: Oxy IR/ROXICODONE Take 1 tablet (5 mg total) by mouth every 6 (six) hours as needed. What changed: reasons to take this   polyethylene glycol 17 g packet Commonly known as: MIRALAX / GLYCOLAX Take 17 g by mouth daily.   Potassium Chloride ER 20 MEQ Tbcr Take 20 mEq by mouth daily.   senna-docusate 8.6-50 MG tablet Commonly known as: Senokot-S Take 1 tablet by mouth 2 (two) times daily. What changed:   how much to take  when to take this  reasons to take this       No Known Allergies  Consultations:  IR   Procedures/Studies: Ct Abdomen Pelvis W Contrast  Result Date: 03/05/2019 CLINICAL DATA:  53 year old male with constipation. History of GI stromal tumor. EXAM: CT ABDOMEN AND PELVIS WITH CONTRAST TECHNIQUE: Multidetector CT imaging of the abdomen and pelvis was performed using the standard protocol following bolus administration of intravenous contrast. CONTRAST:  185mL OMNIPAQUE IOHEXOL 300 MG/ML  SOLN COMPARISON:  07/28/2016 PET CT, 12/15/2015 abdominal/pelvic CT and other studies FINDINGS: Lower chest: No acute abnormality. Hepatobiliary: Multiple new and enlarging hepatic metastases throughout the liver noted. Index LEFT hepatic mass now measures 16.2 x 17.7 cm, previously 11.8 x 17.4 cm at the same level. Hepatic metastatic disease/masses occupy a large portion of the abdomen with compression of other structures. The gallbladder is compressed but without definite abnormality. No definite biliary dilatation. Pancreas: Severely compressed by large hepatic metastasis without gross abnormality. Spleen: Unremarkable Adrenals/Urinary Tract: The kidneys, adrenal glands and visualized bladder are unremarkable except for a RIGHT renal cyst. Stomach/Bowel: Very ill-defined abnormal soft tissue  masses/bowel wall thickening within the pelvis are again noted likely representing this patient's GI stromal tumor/metastatic disease. No dilated bowel loops are noted. Moderate stool in the colon is noted. Bowel loops are severely compressed by large LEFT hepatic/abdominal metastasis. No dilated bowel loops are identified. Vascular/Lymphatic: Aortic atherosclerosis. No enlarged abdominal or pelvic lymph nodes. Reproductive: No gross abnormality Other: Small amount of ascites is noted. A 12.7 cm fluid-filled structure anterior to the liver mid abdomen may represent focal ascites or possibly fluid within a bowel loop. No evidence of pneumoperitoneum. Musculoskeletal: No acute or suspicious bony abnormality. IMPRESSION: 1. Multiple new and enlarging hepatic metastases with very large dominant LEFT hepatic/abdominal metastasis compressing many structures within the abdomen and UPPER pelvis. 2. Very ill-defined abnormal soft tissue masses/bowel wall thickening within the pelvis likely representing this patient's GI stromal tumor/metastatic disease. Small amount of ascites. 3. 12.7 cm fluid-filled structure anterior to the liver mid abdomen may represent focal ascites or possibly fluid within a bowel loop. No other evidence of bowel obstruction or pneumoperitoneum. 4. Aortic Atherosclerosis (ICD10-I70.0). Electronically Signed   By: Margarette Canada M.D.   On: 03/05/2019 16:51   [place] Image Guided Drainage Percut Cath  Peritoneal Retroperit  Result Date: 03/06/2019 INDICATION: 53 year old male with malignant gastrointestinal stromal tumor and a new fluctuant, painful and erythematous mass in the anterior abdominal wall concerning for abscess. He presents for ultrasound-guided drain placement. EXAM: Ultrasound-guided drain placement MEDICATIONS: The patient is currently admitted to the hospital and receiving intravenous antibiotics. The antibiotics were administered within an appropriate time frame prior to  the initiation of  the procedure. ANESTHESIA/SEDATION: Fentanyl 100 mcg IV; Versed 4 mg IV Moderate Sedation Time:  20 minutes The patient was continuously monitored during the procedure by the interventional radiology nurse under my direct supervision. COMPLICATIONS: None immediate. PROCEDURE: Informed written consent was obtained from the patient after a thorough discussion of the procedural risks, benefits and alternatives. All questions were addressed. A timeout was performed prior to the initiation of the procedure. The abdominal wall was interrogated with ultrasound. There is a complex cystic fluid collection in the superficial soft tissues of the mid line anterior abdominal wall. A suitable skin entry site was identified and marked the overlying skin was sterilely prepped and draped in the standard fashion using chlorhexidine skin prep. Local anesthesia was attained by infiltration with 1% lidocaine. A small dermatotomy was made. Under real-time sonographic guidance, an 18 gauge trocar needle was advanced into the fluid collection. A 0.035 wire was then coiled in the fluid collection. The trocar needle was removed. The soft tissues were dilated to 12 [place]. A Cook 12 [place] all-purpose drainage catheter was advanced over the wire and formed. Aspiration was performed yielding 250 mL of foul-smelling thin murky fluid. Samples were sent for culture. The drainage catheter was connected to JP bulb suction and secured to the skin with both and 0 Prolene retention suture and an adhesive fixation device. The patient tolerated the procedure well. IMPRESSION: Successful placement of a 10 French all-purpose drainage catheter. Aspiration yields 250 mL of foul-smelling thin murky fluid. Samples were sent for Gram stain and culture. PLAN: 1. Follow cultures. 2. Maintain Jane to JP bulb suction. 3. Flush drain at least once per shift with 10 mL sterile saline or water. 4. Once drain output has become minimal (less than 10 mL per 24 hours for  at least 48 hours) the drain can be removed. Signed, Criselda Peaches, MD, [facility] Electronically Signed   By: Jacqulynn Cadet M.D.   On: 03/06/2019 13:40      Subjective: Patient seen and examined the bedside this morning.  Hemodynamically stable for discharge.  Discharge Exam: Vitals:   03/06/19 2054 03/07/19 0404  BP: (!) 121/92 98/74  Pulse: 91 88  Resp: 18 14  Temp: 97.7 F (36.5 C) 98.1 F (36.7 C)  SpO2: 100% 100%   Vitals:   03/06/19 1145 03/06/19 1215 03/06/19 2054 03/07/19 0404  BP: 109/82 100/82 (!) 121/92 98/74  Pulse: 81 82 91 88  Resp: 19 18 18 14   Temp:  98.9 F (37.2 C) 97.7 F (36.5 C) 98.1 F (36.7 C)  TempSrc:  Oral Oral Oral  SpO2: 100% 99% 100% 100%  Weight:      Height:        General: Pt is alert, awake, not in acute distress Cardiovascular: RRR, S1/S2 +, no rubs, no gallops Respiratory: CTA bilaterally, no wheezing, no rhonchi Abdominal: Abdominal drain, distended abdomen, diffuse tenderness Extremities: no edema, no cyanosis    The results of significant diagnostics from this hospitalization (including imaging, microbiology, ancillary and laboratory) are listed below for reference.     Microbiology: Recent Results (from the past 240 hour(s))  SARS CORONAVIRUS 2 (TAT 6-24 HRS) Nasopharyngeal Nasopharyngeal Swab     Status: None   Collection Time: 03/05/19  4:55 PM   Specimen: Nasopharyngeal Swab  Result Value Ref Range Status   SARS Coronavirus 2 NEGATIVE NEGATIVE Final    Comment: (NOTE) SARS-CoV-2 target nucleic acids are NOT  DETECTED. The SARS-CoV-2 RNA is generally detectable in upper and lower respiratory specimens during the acute phase of infection. Negative results do not preclude SARS-CoV-2 infection, do not rule out co-infections with other pathogens, and should not be used as the sole basis for treatment or other patient management decisions. Negative  results must be combined with clinical observations, patient history, and epidemiological information. The expected result is Negative. Fact Sheet for Patients: SugarRoll.be Fact Sheet for Healthcare Providers: https://www.woods-mathews.com/ This test is not yet approved or cleared by the [place] FDA and  has been authorized for detection and/or diagnosis of SARS-CoV-2 by FDA under an Emergency Use Authorization (EUA). This EUA will remain  in effect (meaning this test can be used) for the duration of the COVID-19 declaration under Section 56 4(b)(1) of the Act, 21 U.S.C. section 360bbb-3(b)(1), unless the authorization is terminated or revoked sooner. Performed at [facility], [place] [address], [place], [place] [address]   Aerobic/Anaerobic Culture (surgical/deep wound)     Status: None (Preliminary result)   Collection Time: 03/06/19 11:56 AM   Specimen: Abscess  Result Value Ref Range Status   Specimen Description ABSCESS ABDOMEN  Final   Special Requests DRAINAGE  Final   Gram Stain   Final    FEW WBC PRESENT, PREDOMINANTLY PMN ABUNDANT GRAM POSITIVE COCCI IN PAIRS ABUNDANT GRAM POSITIVE RODS ABUNDANT GRAM NEGATIVE RODS    Culture   Final    TOO YOUNG TO READ Performed at [facility], [place] [address] [place], [place] [address]    Report Status PENDING  Incomplete     Labs: BNP (last 3 results) No results for input(s): BNP in the last 8760 hours. Basic Metabolic Panel: Recent Labs  Lab 03/05/19 1504 03/06/19 0448  NA 136 136  K 3.9 3.7  CL 102 105  CO2 25 22  GLUCOSE 137* 93  BUN 15 14  CREATININE 0.64 0.56*  CALCIUM 9.5 9.1   Liver Function Tests: Recent Labs  Lab 03/05/19 1504  AST 14*  ALT 15  ALKPHOS 147*  BILITOT 0.6  PROT 7.7  ALBUMIN 2.3*   Recent Labs  Lab 03/05/19 1504  LIPASE 12   No results for input(s): AMMONIA in the last 168 hours. CBC: Recent Labs  Lab 03/05/19 1504  03/06/19 0448 03/07/19 0456  WBC 7.7 8.3 6.1  NEUTROABS 6.9  --  5.2  HGB 6.8* 7.4* 7.0*  HCT 23.3* 24.5* 24.5*  MCV 78.7* 78.5* 83.9  PLT 463* 373 333   Cardiac Enzymes: No results for input(s): CKTOTAL, CKMB, CKMBINDEX, TROPONINI in the last 168 hours. BNP: Invalid input(s): POCBNP CBG: No results for input(s): GLUCAP in the last 168 hours. D-Dimer No results for input(s): DDIMER in the last 72 hours. Hgb A1c No results for input(s): HGBA1C in the last 72 hours. Lipid Profile No results for input(s): CHOL, HDL, LDLCALC, TRIG, CHOLHDL, LDLDIRECT in the last 72 hours. Thyroid function studies No results for input(s): TSH, T4TOTAL, T3FREE, THYROIDAB in the last 72 hours.  Invalid input(s): FREET3 Anemia work up No results for input(s): VITAMINB12, FOLATE, FERRITIN, TIBC, IRON, RETICCTPCT in the last 72 hours. Urinalysis    Component Value Date/Time   COLORURINE YELLOW 03/05/2019 1504   APPEARANCEUR CLEAR 03/05/2019 1504   LABSPEC 1.020 03/05/2019 1504   PHURINE 6.0 03/05/2019 [place] 03/05/2019 [place] 03/05/2019 [place] 03/05/2019 [place] 03/05/2019 [place]  30 (A) 03/05/2019 1504   NITRITE NEGATIVE 03/05/2019 1504   LEUKOCYTESUR NEGATIVE 03/05/2019 1504   Sepsis Labs Invalid input(s): PROCALCITONIN,  WBC,  LACTICIDVEN Microbiology Recent Results (from the past 240 hour(s))  SARS CORONAVIRUS 2 (TAT 6-24 HRS) Nasopharyngeal Nasopharyngeal Swab     Status: None   Collection Time: 03/05/19  4:55 PM   Specimen: Nasopharyngeal Swab  Result Value Ref Range Status   SARS Coronavirus 2 NEGATIVE NEGATIVE Final    Comment: (NOTE) SARS-CoV-2 target nucleic acids are NOT DETECTED. The SARS-CoV-2 RNA is generally detectable in upper and lower respiratory specimens during the acute phase of infection. Negative results do not preclude SARS-CoV-2 infection, do not rule out co-infections with other  pathogens, and should not be used as the sole basis for treatment or other patient management decisions. Negative results must be combined with clinical observations, patient history, and epidemiological information. The expected result is Negative. Fact Sheet for Patients: SugarRoll.be Fact Sheet for Healthcare Providers: https://www.woods-mathews.com/ This test is not yet approved or cleared by the [place] FDA and  has been authorized for detection and/or diagnosis of SARS-CoV-2 by FDA under an Emergency Use Authorization (EUA). This EUA will remain  in effect (meaning this test can be used) for the duration of the COVID-19 declaration under Section 56 4(b)(1) of the Act, 21 U.S.C. section 360bbb-3(b)(1), unless the authorization is terminated or revoked sooner. Performed at [facility], [place] [address] [place], [place] [address]   Aerobic/Anaerobic Culture (surgical/deep wound)     Status: None (Preliminary result)   Collection Time: 03/06/19 11:56 AM   Specimen: Abscess  Result Value Ref Range Status   Specimen Description ABSCESS ABDOMEN  Final   Special Requests DRAINAGE  Final   Gram Stain   Final    FEW WBC PRESENT, PREDOMINANTLY PMN ABUNDANT GRAM POSITIVE COCCI IN PAIRS ABUNDANT GRAM POSITIVE RODS ABUNDANT GRAM NEGATIVE RODS    Culture   Final    TOO YOUNG TO READ Performed at [facility], [place] [address] [place], [place] [address]    Report Status PENDING  Incomplete    Please note: You were cared for by a hospitalist during your hospital stay. Once you are discharged, your primary care physician will handle any further medical issues. Please note that NO REFILLS for any discharge medications will be authorized once you are discharged, as it is imperative that you return to your primary care physician (or establish a relationship with a primary care physician if you do not have one) for your post hospital  discharge needs so that they can reassess your need for medications and monitor your lab values.    Time coordinating discharge: 40 minutes  SIGNED:   Shelly Coss, MD  [facility] 03/07/2019, 10:34 AM Pager [phone]  If 7PM-7AM, please contact night-coverage [email or web address] Password [email or web address]

## 2019-03-08 ENCOUNTER — Other Ambulatory Visit: Payer: Self-pay | Admitting: Radiology

## 2019-03-08 DIAGNOSIS — L02211 Cutaneous abscess of abdominal wall: Secondary | ICD-10-CM

## 2019-03-09 ENCOUNTER — Encounter: Payer: Self-pay | Admitting: *Deleted

## 2019-03-09 ENCOUNTER — Ambulatory Visit
Admission: RE | Admit: 2019-03-09 | Discharge: 2019-03-09 | Disposition: A | Discharge status: Home / Self Care | Payer: Medicare Other | Source: Ambulatory Visit | Attending: Radiology | Admitting: Radiology

## 2019-03-09 DIAGNOSIS — L02211 Cutaneous abscess of abdominal wall: Secondary | ICD-10-CM

## 2019-03-09 HISTORY — PX: IR RADIOLOGIST EVAL & MGMT: IMG5224

## 2019-03-09 NOTE — Progress Notes (Addendum)
Patient ID: Caleb Foley, male   DOB: 02/05/1966, 53 y.o.   MRN: [id number]   Hx malignant GIST Mets to liver and pelvis  Follows with Oncology at [place] at home  Was seen in IR 03/06/19: 53 year old male with malignant gastrointestinal stromal tumor and a new fluctuant, painful and erythematous mass in the anterior abdominal wall concerning for abscess. He presents for ultrasound-guided drain placement.  Drain placed.  Flush drain at least once per shift with 10 mL sterile saline or Water. Once drain output has become minimal (less than 10 mL per 24 hours for at least 48 hours) the drain can be removed.    Family called yesterday and reported No OP in 24 hrs Here today for removal  Confirmed No OP in over 24 hrs now I flushed drain easily Site is clean and dry No redness No sign of infection Sl tender   Discussed with Dr Charletta Cousin removed without complication today Dressing placed

## 2019-03-11 LAB — AEROBIC/ANAEROBIC CULTURE W GRAM STAIN (SURGICAL/DEEP WOUND)

## 2019-05-19 DEATH — deceased
